# Patient Record
Sex: Female | Born: 1970 | Race: Black or African American | Hispanic: No | Smoking: Never smoker
Health system: Southern US, Community
[De-identification: ages and names within clinical notes are randomized; demographics above are authoritative.]

## PROBLEM LIST (undated history)

## (undated) ENCOUNTER — Ambulatory Visit (HOSPITAL_COMMUNITY): Admission: EM | Payer: 59

## (undated) DIAGNOSIS — I10 Essential (primary) hypertension: Secondary | ICD-10-CM

## (undated) DIAGNOSIS — R200 Anesthesia of skin: Secondary | ICD-10-CM

## (undated) DIAGNOSIS — Z780 Asymptomatic menopausal state: Secondary | ICD-10-CM

## (undated) DIAGNOSIS — G4730 Sleep apnea, unspecified: Secondary | ICD-10-CM

## (undated) DIAGNOSIS — H469 Unspecified optic neuritis: Secondary | ICD-10-CM

## (undated) DIAGNOSIS — E669 Obesity, unspecified: Secondary | ICD-10-CM

## (undated) DIAGNOSIS — J45909 Unspecified asthma, uncomplicated: Secondary | ICD-10-CM

## (undated) DIAGNOSIS — D649 Anemia, unspecified: Secondary | ICD-10-CM

## (undated) DIAGNOSIS — F32A Depression, unspecified: Secondary | ICD-10-CM

## (undated) HISTORY — DX: Anesthesia of skin: R20.0

## (undated) HISTORY — DX: Unspecified asthma, uncomplicated: J45.909

## (undated) HISTORY — DX: Obesity, unspecified: E66.9

## (undated) HISTORY — PX: TUBAL LIGATION: SHX77

## (undated) HISTORY — DX: Anemia, unspecified: D64.9

## (undated) HISTORY — DX: Sleep apnea, unspecified: G47.30

## (undated) HISTORY — DX: Depression, unspecified: F32.A

## (undated) HISTORY — PX: MOUTH SURGERY: SHX715

## (undated) HISTORY — DX: Essential (primary) hypertension: I10

## (undated) HISTORY — DX: Asymptomatic menopausal state: Z78.0

## (undated) HISTORY — DX: Unspecified optic neuritis: H46.9

---

## 2004-08-28 ENCOUNTER — Inpatient Hospital Stay (HOSPITAL_COMMUNITY): Admission: AD | Admit: 2004-08-28 | Discharge: 2004-08-30 | Payer: Self-pay | Admitting: Obstetrics and Gynecology

## 2004-09-10 ENCOUNTER — Ambulatory Visit: Admission: RE | Admit: 2004-09-10 | Discharge: 2004-09-10 | Payer: Self-pay | Admitting: Obstetrics and Gynecology

## 2005-09-08 ENCOUNTER — Other Ambulatory Visit: Admission: RE | Admit: 2005-09-08 | Discharge: 2005-09-08 | Payer: Self-pay | Admitting: Obstetrics and Gynecology

## 2005-09-30 ENCOUNTER — Ambulatory Visit (HOSPITAL_COMMUNITY): Admission: RE | Admit: 2005-09-30 | Discharge: 2005-09-30 | Payer: Self-pay | Admitting: Obstetrics and Gynecology

## 2006-10-18 ENCOUNTER — Ambulatory Visit (HOSPITAL_COMMUNITY): Admission: RE | Admit: 2006-10-18 | Discharge: 2006-10-18 | Payer: Self-pay | Admitting: Family Medicine

## 2007-01-20 ENCOUNTER — Emergency Department (HOSPITAL_COMMUNITY): Admission: EM | Admit: 2007-01-20 | Discharge: 2007-01-20 | Payer: Self-pay | Admitting: Emergency Medicine

## 2007-04-28 ENCOUNTER — Other Ambulatory Visit: Admission: RE | Admit: 2007-04-28 | Discharge: 2007-04-28 | Payer: Self-pay | Admitting: Obstetrics and Gynecology

## 2007-05-11 ENCOUNTER — Encounter: Admission: RE | Admit: 2007-05-11 | Discharge: 2007-05-11 | Payer: Self-pay | Admitting: Ophthalmology

## 2007-06-01 ENCOUNTER — Encounter (INDEPENDENT_AMBULATORY_CARE_PROVIDER_SITE_OTHER): Payer: Self-pay | Admitting: Ophthalmology

## 2007-06-01 ENCOUNTER — Ambulatory Visit (HOSPITAL_COMMUNITY): Admission: RE | Admit: 2007-06-01 | Discharge: 2007-06-01 | Payer: Self-pay | Admitting: Ophthalmology

## 2010-01-05 IMAGING — CR DG CHEST 1V
1 series · 1 of 1 positions shown · non-contrast
Comparison: NONE

CLINICAL DATA: Positive PPD. Chronic cough. 

CHEST - SINGLE VIEW (PA)

[view not recorded]
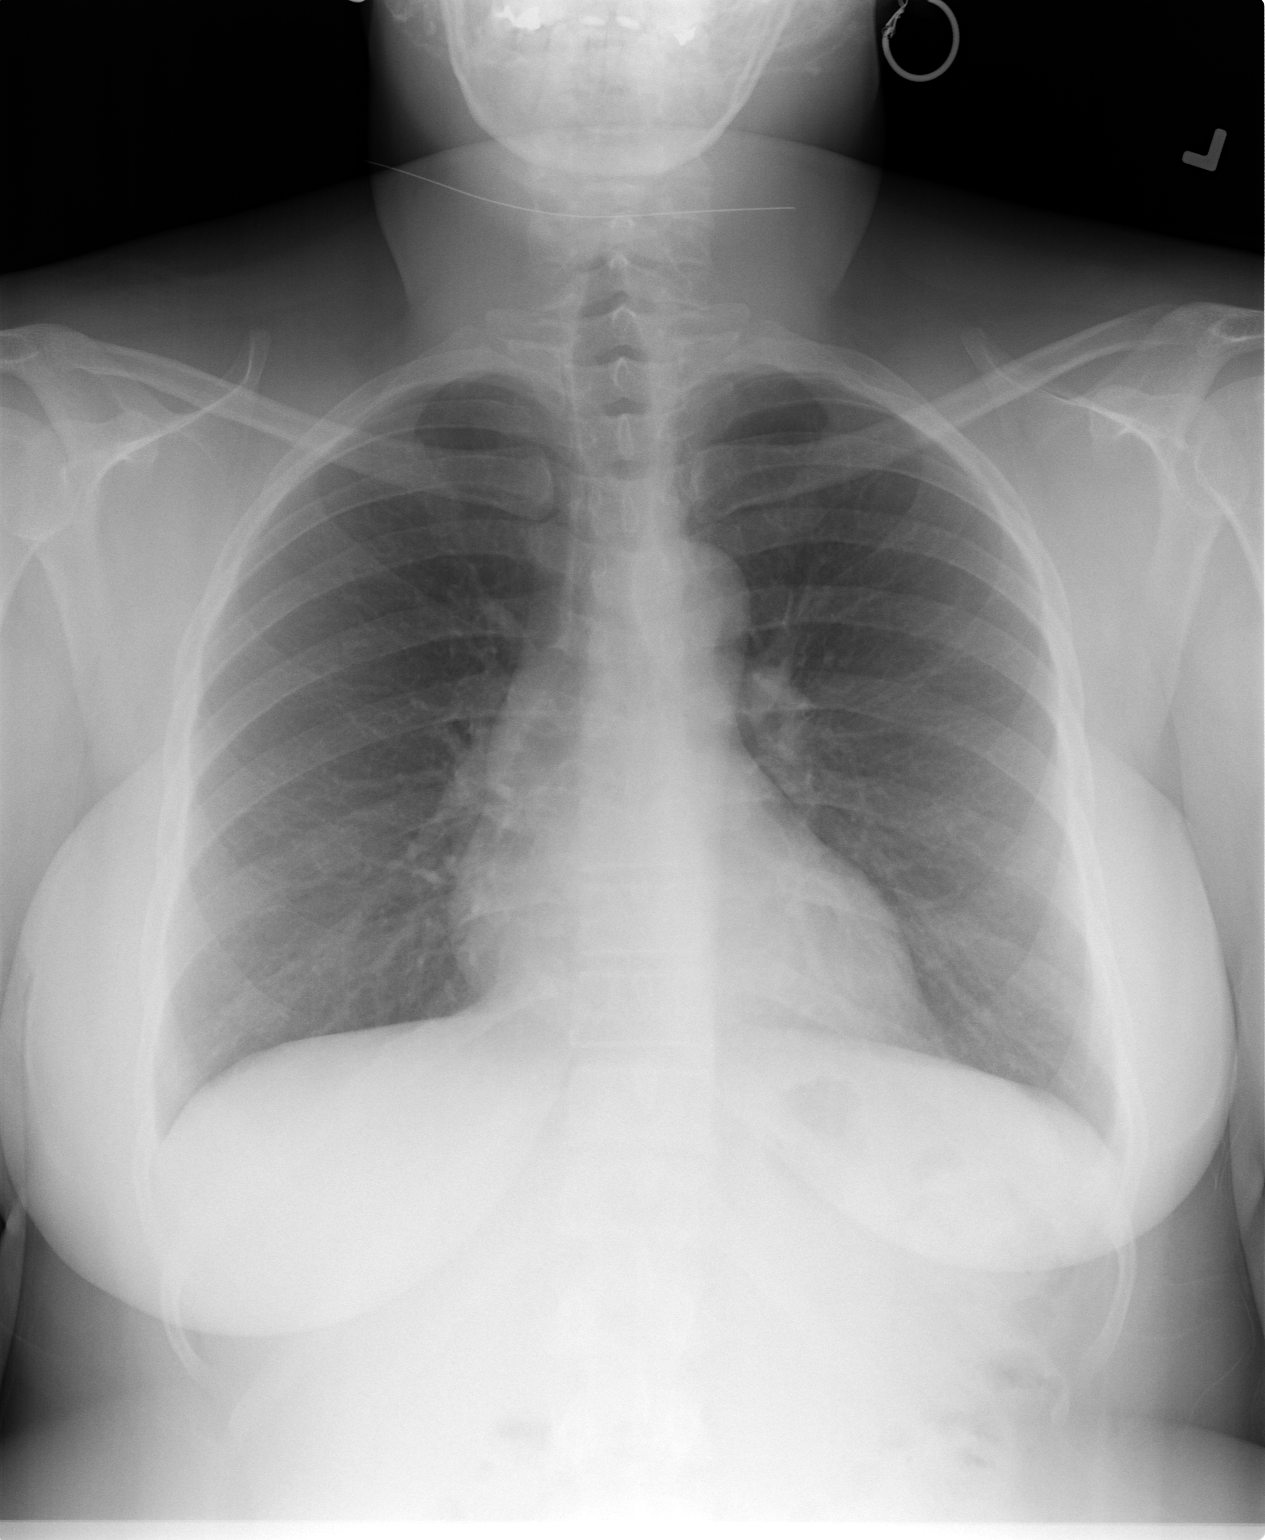

[1 of 1 positions shown; findings below may reference images not displayed]

FINDINGS: The heart size is upper normal. The left lung is clear. 
Located overlying the end of the anterior right fourth rib, there 
is nodular density versus overlapping shadows measuring 1.5 cm. 
Hilar and mediastinal structures are normal. Bones appear normal.
IMPRESSION: Nodular density versus overlapping densities at the 
end of the right fourth rib. I recommend further evaluation with 
repeat PA projection. Borderline cardiomegaly. Joshua Alexander Lagarda, 
M.D. electronically reviewed on 03/30/2007 Dict Date: 03/29/2007  
Tran Date: 03/30/2007 CAV  [REDACTED]

## 2010-01-13 ENCOUNTER — Encounter: Admission: RE | Admit: 2010-01-13 | Discharge: 2010-01-13 | Payer: Self-pay | Admitting: Internal Medicine

## 2010-06-27 NOTE — H&P (Signed)
Robin Fleming, Robin Fleming                ACCOUNT NO.:  [id number]   MEDICAL RECORD NO.:  [id number]          PATIENT TYPE:  MAT   LOCATION:  [facility]                          [facility]ILITY:  [facility]   PHYSICIAN:  Charles A. Delcambre, MDDATE OF BIRTH:  11/02/1970   DATE OF ADMISSION:  DATE OF DISCHARGE:                                HISTORY & PHYSICAL   CHIEF COMPLAINT:  History of delivery at term of 2600 g baby, now 40 weeks  and 5 days to be admitted for induction.   A 39 year old para 1-0-0-1, [facility] August 30, 2004, presents to be induced as  noted above.  Prenatal labs have been normal including quad screen normal.  Group B strep negative.  with noted fetal demise at 6 weeks 4 days, to be  admitted for D&E.  She is Rh negative, did receive RhoGAM on May 27, 2004.  She currently has some vaginal spotting, has passed no tissue.   PAST MEDICAL HISTORY:  None.   SURGICAL HISTORY:  None, SVD x 1.   MEDICATIONS:  Prenatal vitamins.   ALLERGIES:  No known drug allergies.   SOCIAL HISTORY:  No tobacco, ethanol, or drug use.  The patient is married,  in a monogamous relationship with her husband.   FAMILY HISTORY:  Father with kidney failure, deceased.  Otherwise, negative  for major illnesses.   REVIEW OF SYSTEMS:  No chest pain, shortness of breath, wheezing, diarrhea,  constipation.   PHYSICAL EXAMINATION:  GENERAL:  Alert and oriented x 3.  No distress.  VITAL SIGNS:  Blood pressure 100/72, weight 187 pounds.  HEENT:  Grossly within normal limits.  NECK:  Supple without thyromegaly.  LUNGS:  Clear bilaterally.  HEART:  Regular rate and rhythm with a 2/6 systolic murmur left sternal  border.  ABDOMEN:  Gravid.  Fundal height 39 cm the day of dictation.  CERVIX:  Pending examination at this time.  EXTREMITIES:  Mild edema to mid calf.   ASSESSMENT:  Intrauterine pregnancy to be 40 weeks and 5 days at time of  admission.   PLAN:  Artificial rupture of membranes.  High dose Pitocin.   Induction.  The  patient does give informed consent, and we will proceed as outlined.       CAD/MEDQ  D:  08/26/2004  T:  08/26/2004  Job:  [id number]

## 2010-06-27 NOTE — Op Note (Signed)
Robin Fleming, Robin Fleming                ACCOUNT NO.:  [id number]   MEDICAL RECORD NO.:  [id number]          PATIENT TYPE:  AMB   LOCATION:  SDC                           [facility]  [facility]   PHYSICIAN:  Charles A. Delcambre, MDDATE OF BIRTH:  1970-09-17   DATE OF PROCEDURE:  09/30/2005  DATE OF DISCHARGE:                                 OPERATIVE REPORT   PREOPERATIVE DIAGNOSIS:  Undesired fertility.   POSTOPERATIVE DIAGNOSIS:  Undesired fertility.   PROCEDURE:  Laparoscopic bilateral tubal ligation with Falope rings.   SURGEON:  Charles A. Delcambre, MD   ASSISTANT:  None.   COMPLICATIONS:  None.   Instrument, sponge and needle count correct x2.   ESTIMATED BLOOD LOSS:  Less than 5 mL.   ANESTHESIA:  General via the endotracheal route.   SPECIMENS:  None.   OPERATIVE FINDINGS:  Normal pelvis.   DESCRIPTION OF PROCEDURE:  The patient was taken to the operating room and  placed in the supine position and a general anesthetic was induced without  difficulty.  She was then placed in the dorsal lithotomy position and a  sterile prep and drape was undertaken.  A Cohen cannula was placed on the  cervix with a single-tooth tenaculum.  Attention was turned to the abdomen.  Marcaine 0.25% plain was injected at the umbilicus and suprapubic trocar  sites, a total of 7 mL divided between the two sites.  An approximately 1 cm  incision was made at the umbilicus with a knife.  Anterior traction was  placed on the abdominal wall and the Veress needle was placed with  aspiration, injection, reaspiration, hanging drop test, and insufflation  pressure was less than 8 mmHg with CO2 at 2 L/min., all indicating  intraperitoneal location.  Adequate pneumoperitoneum was achieved at 2.5 L  insufflation total.  The Veress needle was removed and anterior traction was  placed again on the abdominal wall and an 11 mm port was placed without  difficulty.  There was no damage to bowel, bladder or vascular  structures  upon visualization directly with the scope placed through this port site.  Under direct visualization the Falope ring applicator port was placed  through a stab incision two fingerbreadths above the symphysis pubis in the  midline.  There was no damage to bowel, bladder or vascular structures.  Pelviscopy was undertaken and findings are noted above.  Falope rings were  applied to the tubes approximately 2.5 cm from the uterine cornual regions  bilaterally.  Adequate knuckle of tube was pulled up through the ring and  blanched on either side.  Hemostasis of the trocar sites was excellent upon  visualization with low pressure.  All instruments were removed.  A 0 Vicryl  was used to close the fascia at the umbilical port with a single stitch.  Subcuticular 3-0 Monocryl was used to close the skin.  A sterile dressing  was  applied.  Dermabond was used to close the suprapubic incision site.  Vaginal  instruments were removed.  Hemostasis was adequate.  The patient awakened  and taken to recovery with physician  in attendance having tolerated the  procedure well.      Charles A. Sydnee Cabal, MD  Electronically Signed     CAD/MEDQ  D:  09/30/2005  T:  10/01/2005  Job:  [id number]

## 2010-11-04 LAB — CSF CELL COUNT WITH DIFFERENTIAL
RBC Count, CSF: 1015 — ABNORMAL HIGH
Tube #: 3
WBC, CSF: 3

## 2010-11-04 LAB — CSF CULTURE W GRAM STAIN: Culture: NO GROWTH

## 2010-11-04 LAB — PROTEIN AND GLUCOSE, CSF
Glucose, CSF: 66
Total  Protein, CSF: 19

## 2012-01-04 ENCOUNTER — Other Ambulatory Visit: Payer: Self-pay | Admitting: Internal Medicine

## 2012-01-04 DIAGNOSIS — Z1231 Encounter for screening mammogram for malignant neoplasm of breast: Secondary | ICD-10-CM

## 2012-02-19 ENCOUNTER — Ambulatory Visit
Admission: RE | Admit: 2012-02-19 | Discharge: 2012-02-19 | Disposition: A | Discharge status: Home / Self Care | Payer: 59 | Source: Ambulatory Visit | Attending: Internal Medicine | Admitting: Internal Medicine

## 2012-02-19 DIAGNOSIS — Z1231 Encounter for screening mammogram for malignant neoplasm of breast: Secondary | ICD-10-CM

## 2012-04-07 ENCOUNTER — Encounter: Payer: Self-pay | Admitting: *Deleted

## 2012-04-07 ENCOUNTER — Encounter: Payer: 59 | Attending: Obstetrics & Gynecology | Admitting: *Deleted

## 2012-04-07 DIAGNOSIS — Z713 Dietary counseling and surveillance: Secondary | ICD-10-CM | POA: Insufficient documentation

## 2012-04-07 DIAGNOSIS — E669 Obesity, unspecified: Secondary | ICD-10-CM | POA: Insufficient documentation

## 2012-04-07 NOTE — Patient Instructions (Signed)
Plan:  Aim for 3 Carb Choices per meal (45 grams) +/- 1 either way  Aim for 0-1 Carbs per snack if hungry  Consider reading food labels for Total Carbohydrate of foods Consider  increasing your activity level by Hip Hop on DVD for 15-30 minutes daily as tolerated

## 2012-04-07 NOTE — Progress Notes (Signed)
  Medical Nutrition Therapy:  Appt start time: 1630 end time:  1730.  Assessment:  Primary concerns today: patient here for obesity. Lives with children, 2 boys ages 25 and 43 years old. 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 Taking on-line classes for Information Systems. Church on Sunday mornings. Current activity is Hip Hop Abs DVD at home, every day in evenings after work. Also enjoys walking in good weather. Some limitation due to asthma  MEDICATIONS: see list   DIETARY INTAKE:  Usual eating pattern includes 3 meals and 1 snacks per day.  Everyday foods include good variety of all food groups.  Avoided foods include pork, .    24-hr recall:  B ( AM): unsweetened cereal with 2% milk, OR eggs with sausage and tomatoes, occasionally pancakes or 2 toast.   Snk ( AM): grapes or small bag of chips OR peanuts  L ( PM): brings from home: Lean Cuisine dinner and fresh fruit, regular soda 12 oz Snk ( PM): not usually D ( PM): she prepares usually: meat, starch and vegetable type meal, occasionally a bread, fruit juice or Koolaid or Lemonade Snk ( PM): none Beverages:  fruit juice or Koolaid or Lemonade or regular soda 12 oz   Usual physical activity: Current activity is Hip Hop Abs DVD at home, every day in evenings after work. Also enjoys walking in good weather.   Estimated energy needs: 1400 calories 158 g carbohydrates 105 g protein 39 g fat  Progress Towards Goal(s):  In progress.   Nutritional Diagnosis:  NI-1.5 Excessive energy intake As related to activity level.  As evidenced by BMI of 37.3.    Intervention:  Nutrition counseling for weight loss initiated. Discussed Carb Counting as method of portion control. Taught reading of food labels and discussed benefits of increased activity. Also discussed ways to eat healthy with a busy schedule.  Plan:  Aim for 3 Carb Choices per meal (45 grams) +/- 1 either way  Aim for 0-1 Carbs per snack if hungry   Consider reading food labels for Total Carbohydrate of foods Consider  increasing your activity level by Hip Hop on DVD for 15-30 minutes daily as tolerated  Handouts given during visit include: Carb Counting and Food Label handouts Meal Plan Card  Monitoring/Evaluation:  Dietary intake, exercise, reading food labels, and body weight in 4 week(s).

## 2012-05-05 ENCOUNTER — Encounter: Payer: Self-pay | Admitting: *Deleted

## 2012-05-05 ENCOUNTER — Encounter: Payer: 59 | Attending: Obstetrics & Gynecology | Admitting: *Deleted

## 2012-05-05 DIAGNOSIS — E669 Obesity, unspecified: Secondary | ICD-10-CM | POA: Insufficient documentation

## 2012-05-05 DIAGNOSIS — Z713 Dietary counseling and surveillance: Secondary | ICD-10-CM | POA: Insufficient documentation

## 2012-05-05 NOTE — Patient Instructions (Addendum)
Plan: 15 minutes activity per day, either DVD or The 7 Minute Workout, your choice Continue with healthier food choices and reading food labels

## 2012-05-05 NOTE — Progress Notes (Signed)
  Medical Nutrition Therapy:  Appt start time: 1630 end time:  1700.  Assessment:  Primary concerns today: patient here for obesity follow up visit. Weight is stable from last visit. Reading food labels, portions are smaller, she makes adjustments based on carb content of foods and is eating more free foods. Exercise time is limited due to school and work as well as children. School classes are on-line and very time consuming. She is noticing changes in how her clothes fit, waist is smaller.  MEDICATIONS: see list  TANITA  BODY COMP RESULTS 05/05/2012  Weight 193.5 lb   BMI (kg/m^2) 37.8   Fat Mass (lbs) 88.0 lb   Fat Free Mass (lbs) 105.5 lb   Total Body Water (lbs) 77.0 lb   DIETARY INTAKE:  Usual eating pattern includes 3 meals and 1 snacks per day.  Everyday foods include good variety of all food groups.  Avoided foods include pork, .    24-hr recall:  B ( AM): unsweetened cereal with 2% milk, OR eggs with sausage and tomatoes, occasionally pancakes or 2 toast.   Snk ( AM): grapes or small bag of chips OR peanuts  L ( PM): brings from home: Lean Cuisine dinner and fresh fruit, no more regular soda  Snk ( PM): not usually D ( PM): she prepares usually: meat, starch and vegetable type meal, occasionally a bread, fruit juice or Koolaid or Lemonade Snk ( PM): none Beverages:  fruit juice or Koolaid or Lemonade   Usual physical activity: has been limited due to heavy school schedule in addition to work and children.   Estimated energy needs: 1400 calories 158 g carbohydrates 105 g protein 39 g fat  Progress Towards Goal(s):  In progress.   Nutritional Diagnosis:  NI-1.5 Excessive energy intake As related to activity level.  As evidenced by BMI of 37.3.    Intervention:  Acknowledged all the strains she has on her time and suggested she aim for a smaller increment of time towards exercise rather than an expectation of 60 minutes or more. She agrees to aim for 7-15 minutes per  day and we discussed ways to implement this.  Plan: 15 minutes activity per day, either DVD or The 7 Minute Workout, your choice Continue with healthier food choices and reading food labels   Handouts given during visit include:   APP for The 7 Minute Workout   Monitoring/Evaluation:  Dietary intake, exercise, reading food labels, and body weight in 6 week(s).

## 2012-05-18 ENCOUNTER — Ambulatory Visit (INDEPENDENT_AMBULATORY_CARE_PROVIDER_SITE_OTHER): Payer: 59 | Admitting: Obstetrics & Gynecology

## 2012-05-18 ENCOUNTER — Encounter: Payer: Self-pay | Admitting: Obstetrics & Gynecology

## 2012-05-18 VITALS — BP 124/87 | HR 73 | Temp 98.2°F | Ht 60.0 in | Wt 190.0 lb

## 2012-05-18 DIAGNOSIS — N926 Irregular menstruation, unspecified: Secondary | ICD-10-CM

## 2012-05-18 DIAGNOSIS — N393 Stress incontinence (female) (male): Secondary | ICD-10-CM

## 2012-05-18 NOTE — Patient Instructions (Signed)
Transobturator Tape Sling Laughing, lifting objects, coughing, and sneezing all put pressure on your bladder. When this happens, urine sometimes leaks. This problem is called stress urinary incontinence. If this is caused by weak pelvic muscles and ligaments, one way to keep urine from leaking is with a transobturator tape (TOT) sling. In this procedure, a surgeon uses man-made material (the "tape") to create a sling to support the urethra. The sling does the same job that the muscles and ligaments would do if they were not damaged. The name of the procedure comes from the space in your hip bones (the obturator foramen) where the tape is threaded into the body. LET YOUR CAREGIVER KNOW ABOUT:  Allergies.  Medicines you take, including herbs, eyedrops, over-the-counter medicines, and creams.  Blood thinners (anticoagulants), aspirin or other drugs that could affect blood clotting.  Use of steroids (by mouth or creams).  Possibility of pregnancy, if this applies.  History of blood clots or bleeding problems.  Smoking history.  Previous surgery.  Previous problems with anesthetics, including local anesthetics.  Any other health problems. RISKS AND COMPLICATIONS Most women recover without problems. Sometimes, though, women experience:  Difficulty urinating. This may happen temporarily with normal tissue swelling from the surgery or more long term if the sling was pulled up a little too tightly.  Some continued leakage. This can happen if parts of the tape wear away because of rubbing against the body or if the tape was put in with too little tension.  Other complications. These are rare, but possible:  Pain in the groin or vagina.  Infection.  Excessive bleeding.  Damage to the bladder or urethra.  Nerve damage. This can cause a loss of sensation in the area.  A hole in the urethra from erosion caused by the tape. The tape must then be removed.  You may need an additional  surgery. This is rare. BEFORE THE PROCEDURE  Two weeks before your surgery, stop using aspirin and non-steroidal anti-inflammatory drugs (NSAIDs) for pain relief. This includes prescription drugs and over-the-counter drugs, such as ibuprofen and naproxen. Also stop taking vitamin E.  If you take blood-thinners, ask your caregiver when you should stop taking them.  Do not eat or drink for about 8 hours before your surgery.  You might be given a cream to place in your vagina before the procedure.  If your surgery is an outpatient procedure, you will be able to go home the same day. Make arrangements in advance for someone to drive you home. THE PROCEDURE The procedure usually takes 20 to 30 minutes.  Preparation:  The surgery can be done while you are asleep (general anesthesia) or while an area of your body is numb, but you are awake (regional anesthesia).  You will wear a surgical gown. Your groin and vaginal area will be draped with sheets.  Procedure:  The surgeon will make two small cuts (incisions) in your groin, just inside your thighs on either side of the vagina, and one in your vagina under the urethra.  The sling will then be threaded from one incision to the other, under the urethra.  A tube (catheter) will be inserted into your urethra to collect and drain urine during and after the procedure. AFTER THE PROCEDURE  The catheter will be left in place for a 2 to 3 hours. Once it is removed, you will have to urinate on your own before you can go home. An overnight stay is sometimes needed.  The surgeon [phone]  prescribe antibiotic medicines to prevent infection.  You might be given a cream to apply to the vagina to help the area heal.  It could take 2 weeks for urination to feel normal. HOME CARE INSTRUCTIONS   Take any medicine and use any creams that your caregiver prescribed. Follow the directions carefully. Take all of the medicine and use all of the cream.  Ask your  surgeon whether you can take over-the-counter medicines for pain, discomfort, or fever. Do not take aspirin unless your caregiver says that you should. Aspirin increases the chances of bleeding.  Avoid sexual intercourse, douching, using tampons, or placing anything inside your vagina for about 4 weeks. Your caregiver will let you know specifically when it is okay to do these things again.  Limit activities, such as exercising and dancing, for 4 to 6 weeks.  Keep all follow-up tests and appointments with your caregiver. Tests can show whether your urination is back to normal. SEEK MEDICAL CARE IF:   You have pain or frequent urges to urinate.  You have difficulty passing urine.  Your wounds become red or swollen, or they leak fluid or blood.  Your pain increases.  You notice unusual or bad-smelling discharge from your vagina.  You become nauseous or vomit for more than 2 days after the surgery. SEEK IMMEDIATE MEDICAL CARE IF:   You have a fever. Document Released: 04/24/2008 Document Revised: 04/20/2011 Document Reviewed: 04/24/2008 [facility] Patient Information 2013 [place], [place].

## 2012-05-18 NOTE — Progress Notes (Signed)
..   Subjective:     Robin Fleming is a 42 y.o. female here for her ultrasound results and bloodwork. No current complaints.  Personal health questionnaire reviewed: no.   Gynecologic History Patient's last menstrual period was 03/19/2012. Contraception: tubal ligation Last Pap: 2014. Results were: normal Last mammogram: 02/2012. Results were: normal   The following portions of the patient's history were reviewed and updated as appropriate: allergies, current medications, past family history, past medical history, past social history, past surgical history and problem list.  Review of Systems Pertinent items are noted in HPI.    Voiding diary reviewed Objective:    No exam   PVR 10 ml Assessment:   AUB--O  GSUI   Plan:    Referral for PT for urinary incontinence Return prn

## 2012-05-19 ENCOUNTER — Encounter: Payer: Self-pay | Admitting: Obstetrics & Gynecology

## 2012-06-16 ENCOUNTER — Encounter: Payer: 59 | Attending: Obstetrics & Gynecology | Admitting: *Deleted

## 2012-06-16 DIAGNOSIS — Z713 Dietary counseling and surveillance: Secondary | ICD-10-CM | POA: Insufficient documentation

## 2012-06-16 DIAGNOSIS — E669 Obesity, unspecified: Secondary | ICD-10-CM | POA: Insufficient documentation

## 2012-06-16 NOTE — Progress Notes (Signed)
  Medical Nutrition Therapy:  Appt start time: 1730 end time:  1800.  Assessment:  Primary concerns today: patient here for obesity follow up visit. She states she has eliminated bread and pasta in past month. Snacking on apples and carrots. Taking Muscle Milk for breakfast and at night before bed. Exercising with a DVD  for an hour and The 7 Minute Workout APP when she can because school is finished for now. She states she is drinking at least 32 oz water per day and then a little more.  She is noticing changes in how her clothes fit, waist is smaller.  MEDICATIONS: see list  TANITA  BODY COMP RESULTS 05/05/2012  Weight 193.5 lb   BMI (kg/m^2) 37.8   Fat Mass (lbs) 88.0 lb   Fat Free Mass (lbs) 105.5 lb   Total Body Water (lbs) 77.0 lb   TANITA  BODY COMP RESULTS 06/16/2012  Weight 193.5 lb   BMI (kg/m^2) 37.8   Fat Mass (lbs) 89.5 lb   Fat Free Mass (lbs) 104.0 lb   Total Body Water (lbs) 76.0 lb   Basically no change, appears reduction in fat free mass even with increased activity level and improved eating habits. Frustrating for patient today.  DIETARY INTAKE:  Usual eating pattern includes 3 meals and 1 snacks per day.  Everyday foods include good variety of all food groups.  Avoided foods include pork, .    24-hr recall:  B ( AM): unsweetened cereal with 2% milk, OR eggs with sausage and tomatoes, occasionally pancakes or 1 toast. Water Snk ( AM): grapes OR small bag of chips OR peanuts  L ( PM): brings from home: Lean Cuisine dinner and fresh fruit, OR pkg tuna with Activia yogurt, no more regular soda  Snk ( PM): not usually D ( PM): she prepares usually: meat, starch and vegetable type meal, occasionally a bread, fruit juice or water Snk ( PM): none Beverages:  fruit juice or Koolaid or Lemonade   Usual physical activity: has increased to 1 hour a day with her DVD now that school is finished.   Estimated energy needs: 1400 calories 158 g carbohydrates 105 g  protein 39 g fat  Progress Towards Goal(s):  In progress.   Nutritional Diagnosis:  NI-1.5 Excessive energy intake As related to activity level.  As evidenced by BMI of 37.3.    Intervention:  Commended her on her improved eating and activity habits. Encouraged her to consider these behavior changes as part of her success rather that numbers on the scale. She states she is willing to continue with current plan acknowledging that it will take some time.  Plan: Continue with 15  - 30  Minutes activity per day, either DVD or The 7 Minute Workout, your choice Continue with healthier food choices and reading food labels   Handouts given during visit include:   None at this visit  Monitoring/Evaluation:  Dietary intake, exercise, reading food labels, and body weight in 5 month(s).

## 2012-11-16 ENCOUNTER — Encounter: Payer: Self-pay | Admitting: Obstetrics & Gynecology

## 2012-11-16 ENCOUNTER — Ambulatory Visit (INDEPENDENT_AMBULATORY_CARE_PROVIDER_SITE_OTHER): Payer: 59 | Admitting: Obstetrics & Gynecology

## 2012-11-16 VITALS — BP 177/101 | HR 86 | Temp 98.0°F | Ht 64.0 in | Wt 192.0 lb

## 2012-11-16 DIAGNOSIS — N951 Menopausal and female climacteric states: Secondary | ICD-10-CM

## 2012-11-16 NOTE — Progress Notes (Signed)
Subjective:     Robin Fleming is a 42 y.o. female here for a routine exam.  Current complaints: follow up. Pt states she is having night sweats. Pt states she is having some stress incontinence. Pt states her coughing has calmed down so the episodes are less frequent.  Personal health questionnaire reviewed: yes.   Gynecologic History No LMP recorded. Contraception: abstinence Last Pap: 02/2012. Results were: normal Last mammogram: 02/2012. Results were: normal  Obstetric History OB History  No data available     The following portions of the patient's history were reviewed and updated as appropriate: allergies, current medications, past family history, past medical history, past social history, past surgical history and problem list.  Review of Systems Pertinent items are noted in HPI.    Objective:   No exam today  Assessment:    Stress urinary incontinence--less symptomatic  Plan:    Continue to monitor symptoms; return prn

## 2012-11-17 LAB — ESTRADIOL: Estradiol: 19.8 pg/mL

## 2012-11-17 LAB — PROLACTIN: Prolactin: 7.4 ng/mL

## 2012-11-17 LAB — TSH: TSH: 1.12 u[IU]/mL (ref 0.350–4.500)

## 2012-11-17 LAB — FOLLICLE STIMULATING HORMONE: FSH: 86.3 m[IU]/mL

## 2012-11-17 LAB — HCG, SERUM, QUALITATIVE: Preg, Serum: NEGATIVE

## 2012-11-22 ENCOUNTER — Encounter: Payer: Self-pay | Admitting: Obstetrics & Gynecology

## 2012-11-22 DIAGNOSIS — E28319 Asymptomatic premature menopause: Secondary | ICD-10-CM | POA: Insufficient documentation

## 2012-11-24 ENCOUNTER — Telehealth: Payer: Self-pay | Admitting: *Deleted

## 2012-11-24 ENCOUNTER — Encounter: Payer: Self-pay | Admitting: Obstetrics

## 2012-11-24 MED ORDER — ESTRADIOL-LEVONORGESTREL 0.045-0.015 MG/DAY TD PTWK: 1.0000 | MEDICATED_PATCH | TRANSDERMAL | Status: DC

## 2012-11-24 MED ORDER — NORETHIN ACE-ETH ESTRAD-FE 1-20 MG-MCG(24) PO TABS: 1.0000 | ORAL_TABLET | Freq: Every day | ORAL | Status: DC

## 2012-11-24 NOTE — Telephone Encounter (Signed)
Patient presented to office today stating that she has been back and forth with phone calls and was to have a rx sent to the pharmacy. She states she has checked with the pharmacy, and they did not have the medication that was prescribed.  Patient was informed per Dr Tamela Oddi that she is to start loestrin once a day. Patient stated she preferred a patch. She was advised per Dr Tamela Oddi because of age the pill would be better. Patient stated that she is taking other medication and did not want another pill to interfere with her current medications. Patient was informed that Climara Pro would be prescribed for he in place of the pill per Dr Jean RosenthalChristell Constant. Patient was informed rx was sent to pharmacy. She was advised to watch for any changes and report if the medication is not working for her. Patient provided copy of her blood work per her request. She stated that she will be following up with her primary care provider on 11/25/2012 for a check and would have her cholesterol checked at that time. She was informed that [facility] would like a copy of the test results. Patient has verbalized understanding and stated that she  would have a copy of the labs forwarded to office.

## 2013-01-17 ENCOUNTER — Encounter: Payer: Self-pay | Admitting: Obstetrics & Gynecology

## 2013-01-18 ENCOUNTER — Other Ambulatory Visit: Payer: Self-pay | Admitting: Obstetrics & Gynecology

## 2013-01-18 DIAGNOSIS — N926 Irregular menstruation, unspecified: Secondary | ICD-10-CM

## 2013-01-25 ENCOUNTER — Ambulatory Visit: Payer: 59

## 2013-01-25 ENCOUNTER — Ambulatory Visit (INDEPENDENT_AMBULATORY_CARE_PROVIDER_SITE_OTHER): Payer: 59 | Admitting: Obstetrics & Gynecology

## 2013-01-25 ENCOUNTER — Encounter: Payer: Self-pay | Admitting: Obstetrics & Gynecology

## 2013-01-25 VITALS — BP 147/87 | HR 77 | Temp 98.6°F | Wt 190.0 lb

## 2013-01-25 DIAGNOSIS — E28319 Asymptomatic premature menopause: Secondary | ICD-10-CM

## 2013-01-25 MED ORDER — NORETHIN-ETH ESTRAD-FE BIPHAS 1 MG-10 MCG / 10 MCG PO TABS: 1.0000 | ORAL_TABLET | Freq: Every day | ORAL | Status: DC

## 2013-01-25 NOTE — Progress Notes (Signed)
Subjective:     Robin Fleming is a 42 y.o. female here for a follow up exam.  Current complaints: Pt is in office today to f/u on medication.  Pt states that she is having more anger and rage since starting on the Climara that was prescribed on 12/04/12.  Pt states she used her last patch on Saturday.  Pt has since taken her loestrin pill since then.  Pt states that she has last anger since taken pills..  Personal health questionnaire reviewed: yes.   Gynecologic History Patient's last menstrual period was 12/25/2012. Contraception: abstinence   Obstetric History OB History  No data available     The following portions of the patient's history were reviewed and updated as appropriate: allergies, current medications, past family history, past medical history, past social history, past surgical history and problem list.  Review of Systems Pertinent items are noted in HPI.    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

## 2013-01-25 NOTE — Patient Instructions (Signed)
Premenstrual Syndrome Premenstrual syndrome (PMS) is a condition that consists of physical, emotional, and behavioral symptoms that affect women of childbearing age. PMS occurs 5 14 days before the start of a menstrual period and often recurs in a predictable pattern. The symptoms go away a few days after the menstrual period starts. PMS can interfere in many ways with normal daily activities and can range from mild to severe. When PMS is considered severe, it may be diagnosed as premenstrual dysphoric disorder (PMDD). A small percentage of women are affected by PMS symptoms and an even smaller percentage of those women are affected by PMDD.  CAUSES  The exact cause of PMS is unknown, but it seems to be related to cyclic hormone changes that happen before menstruation. These hormones are thought to affect chemicals in the brain (serotonin) that can influence a person's mood.  SYMPTOMS  Symptoms of PMS recur consistently from month to month and go away completely after the menstrual period starts. The most common emotional or behavioral symptom is mood swings. These mood swings can be disabling and interfere with normal activities of daily living. Other common symptoms include depression and angry outbursts. Other symptoms may include:   Irritability.  Anxiety.  Crying spells.   Food cravings or appetite changes.   Changes in sexual desire.   Confusion.   Aggression.   Social withdrawal.   Poor concentration. The most common physical symptoms include a sense of bloating, breast pain, headaches, and extreme fatigue. Other physical symptoms include:   Backaches.   Swelling of the hands and feet.   Weight gain.   Hot flashes.  DIAGNOSIS  To make a diagnosis, your caregiver will ask questions to confirm that you are having a pattern of symptoms. Symptoms must:   Be present 5 days before the start of your period and be present at least 3 months in a row.   End within 4 days  after your period starts.   Interfere with some of your normal activities.  Other conditions, such as thyroid disease, depression, and migraine headaches must be ruled out before a diagnosis of PMS is confirmed.  TREATMENT  Your caregiver may suggest ways to maintain a healthy lifestyle, such as exercise. Over-the-counter pain relievers may ease cramps, aches, pains, headaches, and breast tenderness. However, selective serotonin reuptake inhibitors (SSRIs) are medicines that are most beneficial in improving PMS if taken in the second half of the monthly cycle. They may be taken on a daily basis. The most effective oral contraceptive pill used for symptoms of PMS is one that contains the ingredient drospirenone. Taking 4 days off of the pill instead of the usual 7 days also has shown to increase effectiveness.  There are a number of drugs, dietary supplements, vitamins, and water pills (diuretics) which have been suggested to be helpful but have not shown to be of any benefit to improving PMS symptoms.  HOME CARE INSTRUCTIONS   For 2 3 months, write down your symptoms, their severity, and how long they last. This may help your caregiver prescribe the best treatment for your symptoms.  Exercise regularly as suggested by your caregiver.  Eat a regular, well-balanced diet.  Avoid caffeine, alcohol, and tobacco consumption.  Limit salt and salty foods to lessen bloating and fluid retention.  Get enough sleep. Practice relaxation techniques.  Drink enough fluids to keep your urine clear or pale yellow.  Take medicines as directed by your caregiver.  Limit stress.  Take a multivitamin as directed   by your caregiver. Document Released: 01/24/2000 Document Revised: 10/21/2011 Document Reviewed: 06/15/2011 [employer] Patient Information 2014 [employer].  

## 2013-01-31 ENCOUNTER — Other Ambulatory Visit: Payer: Self-pay | Admitting: Obstetrics & Gynecology

## 2013-01-31 ENCOUNTER — Ambulatory Visit (INDEPENDENT_AMBULATORY_CARE_PROVIDER_SITE_OTHER): Payer: 59

## 2013-01-31 DIAGNOSIS — N926 Irregular menstruation, unspecified: Secondary | ICD-10-CM

## 2013-01-31 DIAGNOSIS — N946 Dysmenorrhea, unspecified: Secondary | ICD-10-CM

## 2013-01-31 DIAGNOSIS — D649 Anemia, unspecified: Secondary | ICD-10-CM

## 2013-03-01 ENCOUNTER — Encounter: Payer: Self-pay | Admitting: Obstetrics & Gynecology

## 2013-04-04 ENCOUNTER — Other Ambulatory Visit: Payer: Self-pay

## 2013-04-04 DIAGNOSIS — Z1231 Encounter for screening mammogram for malignant neoplasm of breast: Secondary | ICD-10-CM

## 2013-04-18 ENCOUNTER — Ambulatory Visit
Admission: RE | Admit: 2013-04-18 | Discharge: 2013-04-18 | Disposition: A | Discharge status: Home / Self Care | Payer: 59 | Source: Ambulatory Visit

## 2013-04-18 DIAGNOSIS — Z1231 Encounter for screening mammogram for malignant neoplasm of breast: Secondary | ICD-10-CM

## 2013-04-27 ENCOUNTER — Encounter: Payer: Self-pay | Admitting: Obstetrics

## 2013-07-26 ENCOUNTER — Ambulatory Visit: Payer: 59 | Admitting: Obstetrics & Gynecology

## 2013-08-03 ENCOUNTER — Ambulatory Visit (INDEPENDENT_AMBULATORY_CARE_PROVIDER_SITE_OTHER): Payer: 59 | Admitting: Obstetrics & Gynecology

## 2013-08-03 ENCOUNTER — Encounter: Payer: Self-pay | Admitting: Obstetrics & Gynecology

## 2013-08-03 VITALS — BP 126/89 | HR 89 | Temp 99.2°F | Ht 61.0 in | Wt 193.0 lb

## 2013-08-03 DIAGNOSIS — N393 Stress incontinence (female) (male): Secondary | ICD-10-CM

## 2013-08-04 NOTE — Progress Notes (Signed)
Patient ID: Robin Fleming, female   DOB: 1970/09/09, 43 y.o.   MRN: [id number]  Chief Complaint  Patient presents with  . Follow-up    HPI Robin Fleming is a 43 y.o. female.  C/O stress urinary incontinence symptoms  HPI  Past Medical History  Diagnosis Date  . Obesity   . Asthma     Past Surgical History  Procedure Laterality Date  . Tubal ligation    . Mouth surgery      Family History  Problem Relation Age of Onset  . Hypertension Mother   . Kidney disease Father     Social History History  Substance Use Topics  . Smoking status: Never Smoker   . Smokeless tobacco: Never Used  . Alcohol Use: No    Allergies  Allergen Reactions  . Gadolinium      Desc: Pt developed nausea and vomiting directly after hand injecting 17cc Multihance for MRI Brain.     Current Outpatient Prescriptions  Medication Sig Dispense Refill  . albuterol (PROVENTIL) (2.5 MG/3ML) 0.083% nebulizer solution Take 2.5 mg by nebulization every 6 (six) hours as needed for wheezing.      . montelukast (SINGULAIR) 10 MG tablet       . Norethindrone-Ethinyl Estradiol-Fe Biphas (LO LOESTRIN FE) 1 MG-10 MCG / 10 MCG tablet Take 1 tablet by mouth daily.  1 Package  11   No current facility-administered medications for this visit.    Review of Systems Review of Systems Constitutional: negative for fatigue and weight loss Respiratory: negative for cough and wheezing Cardiovascular: negative for chest pain, fatigue and palpitations Gastrointestinal: negative for abdominal pain and change in bowel habits Genitourinary: positive for loss of urine with coughing/sneezing, nocturia Integument/breast: negative for nipple discharge Musculoskeletal:negative for myalgias Neurological: negative for gait problems and tremors Behavioral/Psych: negative for abusive relationship, depression Endocrine: negative for temperature intolerance     Blood pressure 126/89, pulse 89, temperature 99.2 F (37.3 C), height 5'  1" (1.549 m), weight 87.544 kg (193 lb).  Physical Exam Physical Exam   50% of 15 min visit spent on counseling and coordination of care.   Data Reviewed None  Assessment    ?Mixed incontinence, predominant symptoms c/w GSUI    Plan    Possible management options include: TOT, PT or both Follow up as needed or in 2 weeks for possible pre-op visit         JACKSON-MOORE,LISA A 08/04/2013, 12:19 PM

## 2013-08-04 NOTE — Patient Instructions (Signed)
Transobturator Tape Sling Laughing, lifting objects, coughing, and sneezing all put pressure on your bladder. When this happens, urine sometimes leaks. This problem is called stress urinary incontinence. If this is caused by weak pelvic muscles and ligaments, one way to keep urine from leaking is with a transobturator tape (TOT) sling. In this procedure, a surgeon uses man-made material (the "tape") to create a sling to support the urethra. The sling does the same job that the muscles and ligaments would do if they were not damaged. The name of the procedure comes from the space in your hip bones (the obturator foramen) where the tape is threaded into the body. LET YOUR CAREGIVER KNOW ABOUT:  Allergies.  Medicines you take, including herbs, eyedrops, over-the-counter medicines, and creams.  Blood thinners (anticoagulants), aspirin or other drugs that could affect blood clotting.  Use of steroids (by mouth or creams).  Possibility of pregnancy, if this applies.  History of blood clots or bleeding problems.  Smoking history.  Previous surgery.  Previous problems with anesthetics, including local anesthetics.  Any other health problems. RISKS AND COMPLICATIONS Most women recover without problems. Sometimes, though, women experience:  Difficulty urinating. This may happen temporarily with normal tissue swelling from the surgery or more long term if the sling was pulled up a little too tightly.  Some continued leakage. This can happen if parts of the tape wear away because of rubbing against the body or if the tape was put in with too little tension.  Other complications. These are rare, but possible:  Pain in the groin or vagina.  Infection.  Excessive bleeding.  Damage to the bladder or urethra.  Nerve damage. This can cause a loss of sensation in the area.  A hole in the urethra from erosion caused by the tape. The tape must then be removed.  You may need an additional  surgery. This is rare. BEFORE THE PROCEDURE  Two weeks before your surgery, stop using aspirin and non-steroidal anti-inflammatory drugs (NSAIDs) for pain relief. This includes prescription drugs and over-the-counter drugs, such as ibuprofen and naproxen. Also stop taking vitamin E.  If you take blood-thinners, ask your caregiver when you should stop taking them.  Do not eat or drink for about 8 hours before your surgery.  You might be given a cream to place in your vagina before the procedure.  If your surgery is an outpatient procedure, you will be able to go home the same day. Make arrangements in advance for someone to drive you home. THE PROCEDURE The procedure usually takes 20 to 30 minutes.  Preparation:  The surgery can be done while you are asleep (general anesthesia) or while an area of your body is numb, but you are awake (regional anesthesia).  You will wear a surgical gown. Your groin and vaginal area will be draped with sheets.  Procedure:  The surgeon will make two small cuts (incisions) in your groin, just inside your thighs on either side of the vagina, and one in your vagina under the urethra.  The sling will then be threaded from one incision to the other, under the urethra.  A tube (catheter) will be inserted into your urethra to collect and drain urine during and after the procedure. AFTER THE PROCEDURE  The catheter will be left in place for a 2 to 3 hours. Once it is removed, you will have to urinate on your own before you can go home. An overnight stay is sometimes needed.  The surgeon [phone]  prescribe antibiotic medicines to prevent infection.  You might be given a cream to apply to the vagina to help the area heal.  It could take 2 weeks for urination to feel normal. HOME CARE INSTRUCTIONS   Take any medicine and use any creams that your caregiver prescribed. Follow the directions carefully. Take all of the medicine and use all of the cream.  Ask your  surgeon whether you can take over-the-counter medicines for pain, discomfort, or fever. Do not take aspirin unless your caregiver says that you should. Aspirin increases the chances of bleeding.  Avoid sexual intercourse, douching, using tampons, or placing anything inside your vagina for about 4 weeks. Your caregiver will let you know specifically when it is okay to do these things again.  Limit activities, such as exercising and dancing, for 4 to 6 weeks.  Keep all follow-up tests and appointments with your caregiver. Tests can show whether your urination is back to normal. SEEK MEDICAL CARE IF:   You have pain or frequent urges to urinate.  You have difficulty passing urine.  Your wounds become red or swollen, or they leak fluid or blood.  Your pain increases.  You notice unusual or bad-smelling discharge from your vagina.  You become nauseous or vomit for more than 2 days after the surgery. SEEK IMMEDIATE MEDICAL CARE IF:   You have a fever. Document Released: 04/24/2008 Document Revised: 04/20/2011 Document Reviewed: 04/24/2008 [facility] Patient Information 2015 [place], [place]. This information is not intended to replace advice given to you by your health care provider. Make sure you discuss any questions you have with your health care provider.

## 2013-08-28 ENCOUNTER — Ambulatory Visit: Payer: 59 | Admitting: Obstetrics & Gynecology

## 2013-12-11 ENCOUNTER — Encounter: Payer: Self-pay | Admitting: Obstetrics & Gynecology

## 2013-12-24 ENCOUNTER — Encounter (HOSPITAL_COMMUNITY): Payer: Self-pay

## 2013-12-24 ENCOUNTER — Emergency Department (HOSPITAL_COMMUNITY)
Admission: EM | Admit: 2013-12-24 | Discharge: 2013-12-24 | Disposition: A | Discharge status: Home / Self Care | Payer: 59 | Attending: Emergency Medicine | Admitting: Emergency Medicine

## 2013-12-24 ENCOUNTER — Emergency Department (HOSPITAL_COMMUNITY): Payer: 59

## 2013-12-24 DIAGNOSIS — R42 Dizziness and giddiness: Secondary | ICD-10-CM | POA: Diagnosis not present

## 2013-12-24 DIAGNOSIS — Z79899 Other long term (current) drug therapy: Secondary | ICD-10-CM | POA: Insufficient documentation

## 2013-12-24 DIAGNOSIS — E669 Obesity, unspecified: Secondary | ICD-10-CM | POA: Insufficient documentation

## 2013-12-24 DIAGNOSIS — J45909 Unspecified asthma, uncomplicated: Secondary | ICD-10-CM | POA: Diagnosis not present

## 2013-12-24 DIAGNOSIS — R109 Unspecified abdominal pain: Secondary | ICD-10-CM

## 2013-12-24 DIAGNOSIS — N760 Acute vaginitis: Secondary | ICD-10-CM | POA: Insufficient documentation

## 2013-12-24 DIAGNOSIS — Z9851 Tubal ligation status: Secondary | ICD-10-CM | POA: Diagnosis not present

## 2013-12-24 DIAGNOSIS — B9689 Other specified bacterial agents as the cause of diseases classified elsewhere: Secondary | ICD-10-CM

## 2013-12-24 LAB — URINE MICROSCOPIC-ADD ON

## 2013-12-24 LAB — BASIC METABOLIC PANEL
Anion gap: 11 (ref 5–15)
BUN: 10 mg/dL (ref 6–23)
CHLORIDE: 103 meq/L (ref 96–112)
CO2: 24 meq/L (ref 19–32)
Calcium: 8.9 mg/dL (ref 8.4–10.5)
Creatinine, Ser: 0.98 mg/dL (ref 0.50–1.10)
GFR calc Af Amer: 80 mL/min — ABNORMAL LOW (ref >90)
GFR calc non Af Amer: 69 mL/min — ABNORMAL LOW (ref >90)
GLUCOSE: 83 mg/dL (ref 70–99)
POTASSIUM: 3.6 meq/L — AB (ref 3.7–5.3)
SODIUM: 138 meq/L (ref 137–147)

## 2013-12-24 LAB — CBC
HCT: 38.1 % (ref 36.0–46.0)
HEMOGLOBIN: 13.1 g/dL (ref 12.0–15.0)
MCH: 33.4 pg (ref 26.0–34.0)
MCHC: 34.4 g/dL (ref 30.0–36.0)
MCV: 97.2 fL (ref 78.0–100.0)
Platelets: 328 10*3/uL (ref 150–400)
RBC: 3.92 MIL/uL (ref 3.87–5.11)
RDW: 13.2 % (ref 11.5–15.5)
WBC: 6.1 10*3/uL (ref 4.0–10.5)

## 2013-12-24 LAB — I-STAT TROPONIN, ED: TROPONIN I, POC: 0.02 ng/mL (ref 0.00–0.08)

## 2013-12-24 LAB — URINALYSIS, ROUTINE W REFLEX MICROSCOPIC
Glucose, UA: NEGATIVE mg/dL
Ketones, ur: 15 mg/dL — AB
NITRITE: NEGATIVE
Protein, ur: NEGATIVE mg/dL
Specific Gravity, Urine: 1.019 (ref 1.005–1.030)
UROBILINOGEN UA: 1 mg/dL (ref 0.0–1.0)
pH: 6 (ref 5.0–8.0)

## 2013-12-24 LAB — WET PREP, GENITAL
TRICH WET PREP: NONE SEEN
Yeast Wet Prep HPF POC: NONE SEEN

## 2013-12-24 LAB — CBG MONITORING, ED: Glucose-Capillary: 85 mg/dL (ref 70–99)

## 2013-12-24 LAB — POC URINE PREG, ED: PREG TEST UR: NEGATIVE

## 2013-12-24 MED ORDER — METRONIDAZOLE 500 MG PO TABS: 500.0000 mg | ORAL_TABLET | Freq: Two times a day (BID) | ORAL | Status: DC

## 2013-12-24 MED ORDER — MECLIZINE HCL 25 MG PO TABS
25.0000 mg | ORAL_TABLET | Freq: Once | ORAL | Status: AC
  Administered 2013-12-24: 25 mg via ORAL
  Filled 2013-12-24: qty 1

## 2013-12-24 MED ORDER — MECLIZINE HCL 25 MG PO TABS: 25.0000 mg | ORAL_TABLET | Freq: Four times a day (QID) | ORAL | Status: DC

## 2013-12-24 NOTE — ED Provider Notes (Addendum)
CSN: [id number]     Arrival date & time 12/24/13  1151 History   First MD Initiated Contact with Patient 12/24/13 1307     Chief Complaint  Patient presents with  . Dizziness  . Abdominal Pain     (Consider location/radiation/quality/duration/timing/severity/associated sxs/prior Treatment) HPI Comments: Patient is a 43 year old female with history of asthma who presents the emergency department today for intermittent dizziness since Friday. She reports that her dizziness is worse when she is changing position. The dizziness is severe and has sudden onset. By closing her eyes and standing in one spot she is able to relieve this feeling. The severe dizziness last approximately seconds to minutes at a time. She initially went to see her primary doctor for this. They diagnosed her with a urinary tract infection and told her this is why she was having the dizziness. She was started on antibiotics which she has been taking as prescribed. She denies any dysuria, urinary urgency, urinary frequency. She did have some suprapubic cramping which has resolved. She denies any fever, chills, nausea, vomiting, chest pain, shortness breath, numbness, weakness, speech difficulty. She denies any personal or family history of early heart disease, stroke.  The history is provided by the patient. No language interpreter was used.    Past Medical History  Diagnosis Date  . Obesity   . Asthma    Past Surgical History  Procedure Laterality Date  . Tubal ligation    . Mouth surgery     Family History  Problem Relation Age of Onset  . Hypertension Mother   . Kidney disease Father    History  Substance Use Topics  . Smoking status: Never Smoker   . Smokeless tobacco: Never Used  . Alcohol Use: No   OB History    Gravida Para Term Preterm AB TAB SAB Ectopic Multiple Living   2 2 2       2      Review of Systems  Constitutional: Negative for fever and chills.  Respiratory: Negative for shortness of  breath.   Cardiovascular: Negative for chest pain.  Gastrointestinal: Negative for nausea, vomiting and abdominal pain.  Genitourinary: Negative for dysuria, frequency and pelvic pain.  Neurological: Positive for dizziness. Negative for headaches.  All other systems reviewed and are negative.     Allergies  Gadolinium  Home Medications   Prior to Admission medications   Medication Sig Start Date End Date Taking? Authorizing Provider  albuterol (PROVENTIL) (2.5 MG/3ML) 0.083% nebulizer solution Take 2.5 mg by nebulization every 6 (six) hours as needed for wheezing.    Historical Provider, MD  montelukast (SINGULAIR) 10 MG tablet  11/12/12   Historical Provider, MD  Norethindrone-Ethinyl Estradiol-Fe Biphas (LO LOESTRIN FE) 1 MG-10 MCG / 10 MCG tablet Take 1 tablet by mouth daily. 01/25/13   Lahoma Crocker, MD   BP 126/78 mmHg  Pulse 84  Temp(Src) 97.9 F (36.6 C) (Oral)  Resp 14  Ht 5\' 2"  (1.575 m)  Wt 187 lb (84.823 kg)  BMI 34.19 kg/m2  SpO2 100%  LMP 12/09/2013 Physical Exam  Constitutional: She is oriented to person, place, and time. She appears well-developed and well-nourished. No distress.  HENT:  Head: Normocephalic and atraumatic.  Right Ear: External ear normal.  Left Ear: External ear normal.  Nose: Nose normal.  Mouth/Throat: Uvula is midline and oropharynx is clear and moist.  Eyes: Conjunctivae and EOM are normal. Pupils are equal, round, and reactive to light.  Neck: Normal range of motion. No  spinous process tenderness present. No rigidity.  Cardiovascular: Normal rate, regular rhythm, normal heart sounds, intact distal pulses and normal pulses.   Pulses:      Radial pulses are 2+ on the right side, and 2+ on the left side.       Posterior tibial pulses are 2+ on the right side, and 2+ on the left side.  Pulmonary/Chest: Effort normal and breath sounds normal. No stridor. No respiratory distress. She has no wheezes. She has no rales.  Abdominal: Soft.  She exhibits no distension. There is no tenderness. There is no rigidity, no rebound and no guarding.  No tenderness to palpation of the abdomen. Patient reports pain in right pelvis.   Genitourinary: There is no rash, tenderness or lesion on the right labia. There is no rash, tenderness or lesion on the left labia. Cervix exhibits discharge and friability. Cervix exhibits no motion tenderness. Right adnexum displays no mass and no tenderness. Left adnexum displays no mass and no tenderness. No erythema in the vagina. No foreign body around the vagina. No signs of injury around the vagina. Vaginal discharge found.  Cervix was mildly friable. Minimal mucoid discharge.   Musculoskeletal: Normal range of motion.  Moves all extremities without guarding or ataxia.   Neurological: She is alert and oriented to person, place, and time. She has normal strength. No sensory deficit. Coordination normal. GCS eye subscore is 4. GCS verbal subscore is 5. GCS motor subscore is 6.  Finger nose finger normal, rapid alternating movements normal, heel knee shin normal. Grip strength 5/5 bilaterally No facial droop No slurred speech  Skin: Skin is warm and dry. She is not diaphoretic. No erythema.  Psychiatric: She has a normal mood and affect. Her behavior is normal.  Nursing note and vitals reviewed.   ED Course  Procedures (including critical care time) Labs Review Labs Reviewed  BASIC METABOLIC PANEL - Abnormal; Notable for the following:    Potassium 3.6 (*)    GFR calc non Af Amer 69 (*)    GFR calc Af Amer 80 (*)    All other components within normal limits  URINALYSIS, ROUTINE W REFLEX MICROSCOPIC - Abnormal; Notable for the following:    Color, Urine AMBER (*)    APPearance CLOUDY (*)    Hgb urine dipstick TRACE (*)    Bilirubin Urine SMALL (*)    Ketones, ur 15 (*)    Leukocytes, UA MODERATE (*)    All other components within normal limits  URINE MICROSCOPIC-ADD ON - Abnormal; Notable for the  following:    Squamous Epithelial / LPF FEW (*)    Casts HYALINE CASTS (*)    Crystals CA OXALATE CRYSTALS (*)    All other components within normal limits  CBC  I-STAT TROPOININ, ED  POC URINE PREG, ED  CBG MONITORING, ED    Imaging Review Dg Chest 2 View  12/24/2013   CLINICAL DATA:  Dizziness and shortness of breath for 2 days. History of asthma.  EXAM: CHEST  2 VIEW  COMPARISON:  01/20/2007  FINDINGS: The heart size and mediastinal contours are within normal limits. Both lungs are clear. The visualized skeletal structures are unremarkable.  IMPRESSION: No active cardiopulmonary disease.   Electronically Signed   By: Curlene Dolphin M.D.   On: 12/24/2013 13:57     EKG Interpretation None      MDM   Final diagnoses:  Abdominal pain, unspecified abdominal location  Bacterial vaginosis    Patient presents to  ED for evaluation of dizziness. Dizziness is consistent with vertigo. Worse with certain movements, resolves with staying in same position and closing eyes. Nonfocal neuro exam. Will give home rx for Antivert. Patient with pelvic pain, but no tenderness. Mild cervical friability and mucoid discharge. No CMT or concern for PID. Will give flagyl. She will follow up with her PCP. Discussed reasons to return to ED immediately. Vital signs stable for discharge. Dr. Jeanell Sparrow evaluated patient and agrees with plan. Patient / Family / Caregiver informed of clinical course, understand medical decision-making process, and agree with plan.     Elwyn Lade, PA-C 12/24/13 [place], MD 12/27/13 [place] Lionardo Haze, MD 12/27/13 [phone]

## 2013-12-24 NOTE — ED Notes (Signed)
Pelvic Cart at Bedside.

## 2013-12-24 NOTE — ED Notes (Signed)
Pt reports dizziness since Friday when she is standing and when she lays down the room starts spinning. Also reports intermittent abd pain. Nauseated no vomiting.

## 2013-12-24 NOTE — Discharge Instructions (Signed)
Vertigo Vertigo means you feel like you or your surroundings are moving when they are not. Vertigo can be dangerous if it occurs when you are at work, driving, or performing difficult activities.  CAUSES  Vertigo occurs when there is a conflict of signals sent to your brain from the visual and sensory systems in your body. There are many different causes of vertigo, including:  Infections, especially in the inner ear.  A bad reaction to a drug or misuse of alcohol and medicines.  Withdrawal from drugs or alcohol.  Rapidly changing positions, such as lying down or rolling over in bed.  A migraine headache.  Decreased blood flow to the brain.  Increased pressure in the brain from a head injury, infection, tumor, or bleeding. SYMPTOMS  You may feel as though the world is spinning around or you are falling to the ground. Because your balance is upset, vertigo can cause nausea and vomiting. You may have involuntary eye movements (nystagmus). DIAGNOSIS  Vertigo is usually diagnosed by physical exam. If the cause of your vertigo is unknown, your caregiver may perform imaging tests, such as an MRI scan (magnetic resonance imaging). TREATMENT  Most cases of vertigo resolve on their own, without treatment. Depending on the cause, your caregiver may prescribe certain medicines. If your vertigo is related to body position issues, your caregiver may recommend movements or procedures to correct the problem. In rare cases, if your vertigo is caused by certain inner ear problems, you may need surgery. HOME CARE INSTRUCTIONS   Follow your caregiver's instructions.  Avoid driving.  Avoid operating heavy machinery.  Avoid performing any tasks that would be dangerous to you or others during a vertigo episode.  Tell your caregiver if you notice that certain medicines seem to be causing your vertigo. Some of the medicines used to treat vertigo episodes can actually make them worse in some people. SEEK  IMMEDIATE MEDICAL CARE IF:   Your medicines do not relieve your vertigo or are making it worse.  You develop problems with talking, walking, weakness, or using your arms, hands, or legs.  You develop severe headaches.  Your nausea or vomiting continues or gets worse.  You develop visual changes.  A family member notices behavioral changes.  Your condition gets worse. MAKE SURE YOU:  Understand these instructions.  Will watch your condition.  Will get help right away if you are not doing well or get worse. Document Released: 11/05/2004 Document Revised: 04/20/2011 Document Reviewed: 08/14/2010 [facility] Patient Information 2015 [place], [place]. This information is not intended to replace advice given to you by your health care provider. Make sure you discuss any questions you have with your health care provider.   Bacterial Vaginosis Bacterial vaginosis is an infection of the vagina. It happens when too many of certain germs (bacteria) grow in the vagina. HOME CARE  Take your medicine as told by your doctor.  Finish your medicine even if you start to feel better.  Do not have sex until you finish your medicine and are better.  Tell your sex partner that you have an infection. They should see their doctor for treatment.  Practice safe sex. Use condoms. Have only one sex partner. GET HELP IF:  You are not getting better after 3 days of treatment.  You have more grey fluid (discharge) coming from your vagina than before.  You have more pain than before.  You have a fever. MAKE SURE YOU:   Understand these instructions.  Will watch your  condition.  Will get help right away if you are not doing well or get worse. Document Released: 11/05/2007 Document Revised: 11/16/2012 Document Reviewed: 09/07/2012 [facility] Patient Information 2015 [place], [place]. This information is not intended to replace advice given to you by your health care provider. Make sure you discuss any  questions you have with your health care provider.

## 2013-12-25 LAB — GC/CHLAMYDIA PROBE AMP
CT Probe RNA: NEGATIVE
GC PROBE AMP APTIMA: NEGATIVE

## 2014-02-05 ENCOUNTER — Encounter: Payer: Self-pay | Admitting: *Deleted

## 2014-02-06 ENCOUNTER — Encounter: Payer: Self-pay | Admitting: Obstetrics & Gynecology

## 2014-03-21 ENCOUNTER — Telehealth: Payer: Self-pay | Admitting: *Deleted

## 2014-03-21 DIAGNOSIS — R102 Pelvic and perineal pain: Secondary | ICD-10-CM

## 2014-03-21 NOTE — Telephone Encounter (Addendum)
Ultrasound scheduled 04/03/14 @ 1015 am.  Attempted to contact patient, no answer, left message for patient to contact clinic and leave a message of the best time to reach her and the best number to call.  Pt needs to be informed of referral and of ultrasound appointment on 04/03/14 @ 1015, front office will call her with an appointment with provider.  2/16  1120  Called and spoke w/pt. I informed her of [place] appt on 2/23 @ 1015 and that she will need to have a full bladder.  Pt voiced understanding and agreed to appt.  Diane Day RNC

## 2014-03-28 ENCOUNTER — Other Ambulatory Visit: Payer: Self-pay

## 2014-03-28 DIAGNOSIS — Z1231 Encounter for screening mammogram for malignant neoplasm of breast: Secondary | ICD-10-CM

## 2014-03-29 ENCOUNTER — Encounter: Payer: Self-pay | Admitting: *Deleted

## 2014-04-03 ENCOUNTER — Other Ambulatory Visit: Payer: Self-pay | Admitting: Obstetrics & Gynecology

## 2014-04-03 ENCOUNTER — Ambulatory Visit (HOSPITAL_COMMUNITY)
Admission: RE | Admit: 2014-04-03 | Discharge: 2014-04-03 | Disposition: A | Discharge status: Home / Self Care | Payer: 59 | Source: Ambulatory Visit | Attending: Obstetrics & Gynecology | Admitting: Obstetrics & Gynecology

## 2014-04-03 DIAGNOSIS — R102 Pelvic and perineal pain: Secondary | ICD-10-CM | POA: Insufficient documentation

## 2014-04-04 ENCOUNTER — Telehealth: Payer: Self-pay | Admitting: General Practice

## 2014-04-04 NOTE — Telephone Encounter (Signed)
-----   Message from Osborne Oman, MD sent at 04/04/2014 11:35 AM EST ----- Normal pelvic ultrasound, no GYN etiology seen for her pain.  Please call to inform patient of results.

## 2014-04-04 NOTE — Telephone Encounter (Signed)
Called patient and informed her of results. Patient verbalized understanding and had no questions. 

## 2014-04-17 ENCOUNTER — Ambulatory Visit (INDEPENDENT_AMBULATORY_CARE_PROVIDER_SITE_OTHER): Payer: 59 | Admitting: Obstetrics

## 2014-04-17 ENCOUNTER — Encounter: Payer: Self-pay | Admitting: Obstetrics

## 2014-04-17 VITALS — BP 140/93 | HR 89 | Temp 98.6°F | Ht 62.0 in | Wt 195.0 lb

## 2014-04-17 DIAGNOSIS — N951 Menopausal and female climacteric states: Secondary | ICD-10-CM

## 2014-04-17 DIAGNOSIS — N393 Stress incontinence (female) (male): Secondary | ICD-10-CM

## 2014-04-17 DIAGNOSIS — Z124 Encounter for screening for malignant neoplasm of cervix: Secondary | ICD-10-CM

## 2014-04-17 DIAGNOSIS — Z01419 Encounter for gynecological examination (general) (routine) without abnormal findings: Secondary | ICD-10-CM | POA: Diagnosis not present

## 2014-04-17 NOTE — Progress Notes (Signed)
Subjective:        Robin Fleming is a 44 y.o. female here for a routine exam.  Current complaints: Hot flashes.  On Lo Loestrin for the vasomotor symptoms, but has had severe headaches and visual changes over the past year.  Stopped taking OCP's this past November.  MRI done over the past year for the headaches was negative.    Personal health questionnaire:  Is patient [name], have a family history of breast and/or ovarian cancer: no Is there a family history of uterine cancer diagnosed at age < 93, gastrointestinal cancer, urinary tract cancer, family member who is a Field seismologist syndrome-associated carrier: no Is the patient overweight and hypertensive, family history of diabetes, personal history of gestational diabetes, preeclampsia or PCOS: no Is patient over 95, have PCOS,  family history of premature CHD under age 38, diabetes, smoke, have hypertension or peripheral artery disease:  no At any time, has a partner hit, kicked or otherwise hurt or frightened you?: no Over the past 2 weeks, have you felt down, depressed or hopeless?: no Over the past 2 weeks, have you felt little interest or pleasure in doing things?:no   Gynecologic History No LMP recorded. Patient is not currently having periods (Reason: Irregular Periods). Contraception: Tubal Ligation Last Pap: 2014. Results were: normal Last mammogram: 2014. Results were: normal  Obstetric History OB History  Gravida Para Term Preterm AB SAB TAB Ectopic Multiple Living  2 2 2       2     # Outcome Date GA Lbr Len/2nd Weight Sex Delivery Anes PTL Lv  2 Term         Y  1 Term         Y      Past Medical History  Diagnosis Date  . Obesity   . Asthma     Past Surgical History  Procedure Laterality Date  . Tubal ligation    . Mouth surgery       Current outpatient prescriptions:  .  albuterol (PROVENTIL HFA;VENTOLIN HFA) 108 (90 BASE) MCG/ACT inhaler, Inhale 2 puffs into the lungs every 6 (six) hours as needed  for wheezing or shortness of breath., Disp: , Rfl:  .  albuterol (PROVENTIL) (2.5 MG/3ML) 0.083% nebulizer solution, Take 2.5 mg by nebulization every 6 (six) hours as needed for wheezing., Disp: , Rfl:  .  montelukast (SINGULAIR) 10 MG tablet, Take 10 mg by mouth daily as needed (for allergies). , Disp: , Rfl:  .  meclizine (ANTIVERT) 25 MG tablet, Take 1 tablet (25 mg total) by mouth 4 (four) times daily. (Patient not taking: Reported on 04/17/2014), Disp: 28 tablet, Rfl: 0 .  metroNIDAZOLE (FLAGYL) 500 MG tablet, Take 1 tablet (500 mg total) by mouth 2 (two) times daily. One po bid x 7 days (Patient not taking: Reported on 04/17/2014), Disp: 14 tablet, Rfl: 0 .  Norethindrone-Ethinyl Estradiol-Fe Biphas (LO LOESTRIN FE) 1 MG-10 MCG / 10 MCG tablet, Take 1 tablet by mouth daily. (Patient not taking: Reported on 04/17/2014), Disp: 1 Package, Rfl: 11 Allergies  Allergen Reactions  . Gadolinium      Desc: Pt developed nausea and vomiting directly after hand injecting 17cc Multihance for MRI Brain.     History  Substance Use Topics  . Smoking status: Never Smoker   . Smokeless tobacco: Never Used  . Alcohol Use: No    Family History  Problem Relation Age of Onset  . Hypertension Mother   . Kidney  disease Father       Review of Systems  Constitutional: negative for fatigue and weight loss Respiratory: negative for cough and wheezing Cardiovascular: negative for chest pain, fatigue and palpitations Gastrointestinal: negative for abdominal pain and change in bowel habits Musculoskeletal:negative for myalgias Neurological: negative for gait problems and tremors Behavioral/Psych: negative for abusive relationship, depression Endocrine: negative for temperature intolerance   Genitourinary:negative for abnormal menstrual periods, genital lesions, sexual problems and vaginal discharge.  Positive for hot flashes, SUI Integument/breast: negative for breast lump, breast tenderness, nipple discharge  and skin lesion(s)    Objective:       BP 140/93 mmHg  Pulse 89  Temp(Src) 98.6 F (37 C)  Ht 5\' 2"  (1.575 m)  Wt 195 lb (88.451 kg)  BMI 35.66 kg/m2 General:   alert  Skin:   no rash or abnormalities  Lungs:   clear to auscultation bilaterally  Heart:   regular rate and rhythm, S1, S2 normal, no murmur, click, rub or gallop  Breasts:   normal without suspicious masses, skin or nipple changes or axillary nodes  Abdomen:  normal findings: no organomegaly, soft, non-tender and no hernia  Pelvis:  External genitalia: normal general appearance Urinary system: urethral meatus normal and bladder without fullness, nontender Vaginal: normal without tenderness, induration or masses Cervix: normal appearance Adnexa: normal bimanual exam Uterus: anteverted and non-tender, normal size   Lab Review Urine pregnancy test Labs reviewed yes Radiologic studies reviewed yes    Assessment:    Healthy female exam.    Perimenopause.  Symptomatic.  Contraceptive management.  HA's and visual changes on OCP's.   Plan:   Stop OCP's because H/O severe HA's. Consider non hormonal contraceptive methods.   Education reviewed: calcium supplements, low fat, low cholesterol diet, self breast exams and weight bearing exercise. Contraception: Nonhormonal methods recommended, i.e. ParaGuard IUD.. Mammogram ordered. Follow up in: 3 months.   No orders of the defined types were placed in this encounter.   No orders of the defined types were placed in this encounter.

## 2014-04-17 NOTE — Addendum Note (Signed)
Addended by: Ladona Ridgel on: 04/17/2014 03:02 PM   Modules accepted: Orders

## 2014-04-18 ENCOUNTER — Telehealth: Payer: Self-pay

## 2014-04-18 NOTE — Telephone Encounter (Signed)
called patient regarding collection balance - billing cannot find where pmt was posted - she needs to get receipt prior to next visit

## 2014-04-18 NOTE — Telephone Encounter (Signed)
Sonia Baller from [employer] stated balance was paid 04/06/14 by credit card - [facility] should receive invoice on 04/10/14 [facility]

## 2014-04-19 LAB — PAP IG AND HPV HIGH-RISK: HPV DNA High Risk: NOT DETECTED

## 2014-04-20 ENCOUNTER — Other Ambulatory Visit: Payer: Self-pay | Admitting: Obstetrics

## 2014-04-20 DIAGNOSIS — B9689 Other specified bacterial agents as the cause of diseases classified elsewhere: Secondary | ICD-10-CM

## 2014-04-20 DIAGNOSIS — N760 Acute vaginitis: Principal | ICD-10-CM

## 2014-04-20 LAB — SURESWAB, VAGINOSIS/VAGINITIS PLUS
Atopobium vaginae: 6.7 Log (cells/mL)
C. ALBICANS, DNA: NOT DETECTED
C. GLABRATA, DNA: NOT DETECTED
C. PARAPSILOSIS, DNA: NOT DETECTED
C. TRACHOMATIS RNA, TMA: NOT DETECTED
C. tropicalis, DNA: NOT DETECTED
Gardnerella vaginalis: >8 Log (cells/mL)
LACTOBACILLUS SPECIES: NOT DETECTED Log (cells/mL)
MEGASPHAERA SPECIES: 7 Log (cells/mL)
N. gonorrhoeae RNA, TMA: NOT DETECTED
T. vaginalis RNA, QL TMA: NOT DETECTED

## 2014-04-20 MED ORDER — TINIDAZOLE 500 MG PO TABS: 1000.0000 mg | ORAL_TABLET | Freq: Every day | ORAL | Status: DC

## 2014-04-23 ENCOUNTER — Other Ambulatory Visit: Payer: Self-pay | Admitting: *Deleted

## 2014-04-23 DIAGNOSIS — N760 Acute vaginitis: Principal | ICD-10-CM

## 2014-04-23 DIAGNOSIS — B9689 Other specified bacterial agents as the cause of diseases classified elsewhere: Secondary | ICD-10-CM

## 2014-04-23 MED ORDER — METRONIDAZOLE 500 MG PO TABS: 500.0000 mg | ORAL_TABLET | Freq: Two times a day (BID) | ORAL | Status: DC

## 2014-04-23 NOTE — Progress Notes (Signed)
Rx change to Metronidazole due to insurance coverage.  [employer] has Tinidazole on a higher tier.

## 2014-05-08 ENCOUNTER — Ambulatory Visit
Admission: RE | Admit: 2014-05-08 | Discharge: 2014-05-08 | Disposition: A | Discharge status: Home / Self Care | Payer: 59 | Source: Ambulatory Visit

## 2014-05-08 ENCOUNTER — Encounter: Payer: Self-pay | Admitting: *Deleted

## 2014-05-08 DIAGNOSIS — Z1231 Encounter for screening mammogram for malignant neoplasm of breast: Secondary | ICD-10-CM

## 2014-06-01 ENCOUNTER — Encounter: Payer: 59 | Admitting: Physician Assistant

## 2014-07-18 ENCOUNTER — Ambulatory Visit: Payer: 59 | Admitting: Obstetrics

## 2014-07-24 ENCOUNTER — Ambulatory Visit (INDEPENDENT_AMBULATORY_CARE_PROVIDER_SITE_OTHER): Payer: 59 | Admitting: Obstetrics

## 2014-07-24 ENCOUNTER — Encounter: Payer: Self-pay | Admitting: Obstetrics

## 2014-07-24 VITALS — BP 125/88 | HR 76 | Temp 98.1°F | Ht 62.0 in | Wt 195.0 lb

## 2014-07-24 DIAGNOSIS — N951 Menopausal and female climacteric states: Secondary | ICD-10-CM | POA: Diagnosis not present

## 2014-07-24 NOTE — Patient Instructions (Signed)

## 2014-07-24 NOTE — Progress Notes (Addendum)
Patient ID: Robin Fleming, female   DOB: Feb 11, 1970, 44 y.o.   MRN: [id number]    HPI Robin Fleming is a 44 y.o. female.  Presents for F/U of symptomatic menopausal symptoms of hot flashes.  Also has had HA's and was on low dose OCP's for source of estrogen for hot flashes, but HA's got worse and we stopped the OCP's.  HA's are now better and she gets relief from HA's with Alleve.  She would still like to take something for the hot flashes. HPI  Past Medical History  Diagnosis Date  . Obesity   . Asthma     Past Surgical History  Procedure Laterality Date  . Tubal ligation    . Mouth surgery      Family History  Problem Relation Age of Onset  . Hypertension Mother   . Kidney disease Father     Social History History  Substance Use Topics  . Smoking status: Never Smoker   . Smokeless tobacco: Never Used  . Alcohol Use: No    Allergies  Allergen Reactions  . Gadolinium      Desc: Pt developed nausea and vomiting directly after hand injecting 17cc Multihance for MRI Brain.     Current Outpatient Prescriptions  Medication Sig Dispense Refill  . albuterol (PROVENTIL HFA;VENTOLIN HFA) 108 (90 BASE) MCG/ACT inhaler Inhale 2 puffs into the lungs every 6 (six) hours as needed for wheezing or shortness of breath.    Marland Kitchen albuterol (PROVENTIL) (2.5 MG/3ML) 0.083% nebulizer solution Take 2.5 mg by nebulization every 6 (six) hours as needed for wheezing.    . montelukast (SINGULAIR) 10 MG tablet Take 10 mg by mouth daily as needed (for allergies).     . hydrochlorothiazide (HYDRODIURIL) 12.5 MG tablet Take 12.5 mg by mouth daily.  2  . meclizine (ANTIVERT) 25 MG tablet Take 1 tablet (25 mg total) by mouth 4 (four) times daily. (Patient not taking: Reported on 04/17/2014) 28 tablet 0  . metroNIDAZOLE (FLAGYL) 500 MG tablet Take 1 tablet (500 mg total) by mouth 2 (two) times daily. (Patient not taking: Reported on 07/24/2014) 14 tablet 0   No current facility-administered medications for this  visit.    Review of Systems Review of Systems Constitutional: negative for fatigue and weight loss Respiratory: negative for cough and wheezing Cardiovascular: negative for chest pain, fatigue and palpitations Gastrointestinal: negative for abdominal pain and change in bowel habits Genitourinary:negative Integument/breast: negative for nipple discharge Musculoskeletal:negative for myalgias Neurological: negative for gait problems and tremors Behavioral/Psych: negative for abusive relationship, depression Endocrine: negative for temperature intolerance     Blood pressure 125/88, pulse 76, temperature 98.1 F (36.7 C), height 5\' 2"  (1.575 m), weight 195 lb (88.451 kg).  Physical Exam Physical Exam:  Deferred  100% of 10 min visit spent on counseling and coordination of care.   Data Reviewed Labs Meds  Assessment     Perimenopause, symptomatic.     Plan    Recommended consultation for trial of some of the herbal supplements for menopausal symptoms.  No hormones. Continue Alleve for HA's F/U 1 year  No orders of the defined types were placed in this encounter.   Meds ordered this encounter  Medications  . hydrochlorothiazide (HYDRODIURIL) 12.5 MG tablet    Sig: Take 12.5 mg by mouth daily.    Refill:  2

## 2014-09-18 ENCOUNTER — Ambulatory Visit (HOSPITAL_BASED_OUTPATIENT_CLINIC_OR_DEPARTMENT_OTHER): Payer: 59 | Attending: Internal Medicine | Admitting: Radiology

## 2014-09-18 VITALS — Ht 62.0 in | Wt 192.0 lb

## 2014-09-18 DIAGNOSIS — R0683 Snoring: Secondary | ICD-10-CM | POA: Diagnosis not present

## 2014-09-18 DIAGNOSIS — G4733 Obstructive sleep apnea (adult) (pediatric): Secondary | ICD-10-CM | POA: Insufficient documentation

## 2014-09-18 DIAGNOSIS — I10 Essential (primary) hypertension: Secondary | ICD-10-CM | POA: Diagnosis present

## 2014-09-22 DIAGNOSIS — G4733 Obstructive sleep apnea (adult) (pediatric): Secondary | ICD-10-CM | POA: Diagnosis not present

## 2014-09-22 NOTE — Progress Notes (Signed)
     Patient Name: Robin Fleming, Robin Fleming Date: 09/18/2014 Gender: Female D.O.B: [birth year] Age (years): 43 Referring Provider: Nolene Ebbs Height (inches): 62 Interpreting Physician: Baird Lyons MD, ABSM Weight (lbs): 192 RPSGT: Carolin Coy BMI: 35 MRN: [id number] Neck Size: 12.00 CLINICAL INFORMATION Sleep Study Type:NPSG  Indication for sleep study: Hypertension, Morning Headaches, Obesity  Epworth Sleepiness Score: 9  SLEEP STUDY TECHNIQUE As per the AASM Manual for the Scoring of Sleep and Associated Events v2.3 (April 2016) with a hypopnea requiring 4% desaturations.  The channels recorded and monitored were frontal, central and occipital EEG, electrooculogram (EOG), submentalis EMG (chin), nasal and oral airflow, thoracic and abdominal wall motion, anterior tibialis EMG, snore microphone, electrocardiogram, and pulse oximetry. Continuous positive airway pressure (CPAP) was initiated when the patient met split night criteria and was titrated according to treat sleep-disordered breathing.  MEDICATIONS Medications taken by the patient : Charted for review Medications administered by patient during sleep study : No sleep medicine administered.  RESPIRATORY PARAMETERS Diagnostic  Total AHI (/hr): 7.7 RDI (/hr): 9.3 OA Index (/hr): 2.5 CA Index (/hr): 0.0 REM AHI (/hr): 32.2 NREM AHI (/hr): 1.6 Supine AHI (/hr): 26.0 Non-supine AHI (/hr): 1.67 Min O2 Sat (%): 82.00 Mean O2 (%): 96.25 Time below 88% (min): 1.2      SLEEP ARCHITECTURE The recording time for the entire night was 433.9 minutes.  During a baseline period of 433.9 minutes, the patient slept for 382.0 minutes in REM and nonREM, yielding a sleep efficiency of 88.0%. Sleep onset after lights out was 9.9 minutes with a REM latency of 115.5 minutes. The patient spent 5.50% of the night in stage N1 sleep, 64.92% in stage N2 sleep, 9.55% in stage N3 and 20.03% in REM.  CARDIAC DATA The 2 lead EKG demonstrated  sinus rhythm. The mean heart rate was 75.17 beats per minute. Other EKG findings include: None.  LEG MOVEMENT DATA The total Periodic Limb Movements of Sleep (PLMS) were 4. The PLMS index was 0.63 .  IMPRESSIONS Mild obstructive sleep apnea occurred during the diagnostic portion of the study (AHI = 7.7 /hour). There were not enough early events to qualify for split protocol CPAP titration.. No significant central sleep apnea occurred during the diagnostic portion of the study (CAI = 0.0/hour). The patient snored with Moderate snoring volume during the diagnostic portion of the study. No cardiac abnormalities were noted during this study. Clinically significant periodic limb movements did not occur during sleep.  DIAGNOSIS Obstructive Sleep Apnea (327.23 [G47.33 ICD-10])  RECOMMENDATIONS The patient can return for dedicated CPAP titration iif appropriate. Avoid alcohol, sedatives and other CNS depressants that may worsen sleep apnea and disrupt normal sleep architecture. Sleep hygiene should be reviewed to assess factors that may improve sleep quality. Weight management and regular exercise should be initiated or continued.    Deneise Lever Diplomate, American Board of Sleep Medicine  ELECTRONICALLY SIGNED ON:  09/22/2014, 10:31 AM [place] PH: [phone]   FX: [phone] [place]

## 2015-01-28 ENCOUNTER — Encounter (HOSPITAL_COMMUNITY): Payer: Self-pay | Admitting: Emergency Medicine

## 2015-01-28 ENCOUNTER — Emergency Department (HOSPITAL_COMMUNITY)
Admission: EM | Admit: 2015-01-28 | Discharge: 2015-01-28 | Disposition: A | Discharge status: Home / Self Care | Payer: 59 | Attending: Emergency Medicine | Admitting: Emergency Medicine

## 2015-01-28 DIAGNOSIS — J45909 Unspecified asthma, uncomplicated: Secondary | ICD-10-CM | POA: Diagnosis not present

## 2015-01-28 DIAGNOSIS — R42 Dizziness and giddiness: Secondary | ICD-10-CM | POA: Diagnosis not present

## 2015-01-28 DIAGNOSIS — R110 Nausea: Secondary | ICD-10-CM | POA: Insufficient documentation

## 2015-01-28 DIAGNOSIS — E669 Obesity, unspecified: Secondary | ICD-10-CM | POA: Insufficient documentation

## 2015-01-28 DIAGNOSIS — Z3202 Encounter for pregnancy test, result negative: Secondary | ICD-10-CM | POA: Insufficient documentation

## 2015-01-28 DIAGNOSIS — Z79899 Other long term (current) drug therapy: Secondary | ICD-10-CM | POA: Insufficient documentation

## 2015-01-28 LAB — URINE MICROSCOPIC-ADD ON

## 2015-01-28 LAB — CBC WITH DIFFERENTIAL/PLATELET
Basophils Absolute: 0 10*3/uL (ref 0.0–0.1)
Basophils Relative: 0 %
EOS PCT: 3 %
Eosinophils Absolute: 0.2 10*3/uL (ref 0.0–0.7)
HCT: 41 % (ref 36.0–46.0)
Hemoglobin: 13.6 g/dL (ref 12.0–15.0)
LYMPHS ABS: 2.5 10*3/uL (ref 0.7–4.0)
Lymphocytes Relative: 42 %
MCH: 32.2 pg (ref 26.0–34.0)
MCHC: 33.2 g/dL (ref 30.0–36.0)
MCV: 96.9 fL (ref 78.0–100.0)
MONO ABS: 0.4 10*3/uL (ref 0.1–1.0)
MONOS PCT: 6 %
Neutro Abs: 2.9 10*3/uL (ref 1.7–7.7)
Neutrophils Relative %: 49 %
PLATELETS: 270 10*3/uL (ref 150–400)
RBC: 4.23 MIL/uL (ref 3.87–5.11)
RDW: 14 % (ref 11.5–15.5)
WBC: 5.9 10*3/uL (ref 4.0–10.5)

## 2015-01-28 LAB — I-STAT TROPONIN, ED: Troponin i, poc: 0 ng/mL (ref 0.00–0.08)

## 2015-01-28 LAB — URINALYSIS, ROUTINE W REFLEX MICROSCOPIC
BILIRUBIN URINE: NEGATIVE
Glucose, UA: NEGATIVE mg/dL
HGB URINE DIPSTICK: NEGATIVE
Ketones, ur: NEGATIVE mg/dL
Nitrite: NEGATIVE
PH: 5.5 (ref 5.0–8.0)
Protein, ur: NEGATIVE mg/dL
SPECIFIC GRAVITY, URINE: 1.01 (ref 1.005–1.030)

## 2015-01-28 LAB — POC URINE PREG, ED: Preg Test, Ur: NEGATIVE

## 2015-01-28 MED ORDER — MECLIZINE HCL 25 MG PO TABS
25.0000 mg | ORAL_TABLET | Freq: Once | ORAL | Status: AC
  Administered 2015-01-28: 25 mg via ORAL
  Filled 2015-01-28: qty 1

## 2015-01-28 MED ORDER — MECLIZINE HCL 25 MG PO TABS: 25.0000 mg | ORAL_TABLET | Freq: Two times a day (BID) | ORAL | Status: DC | PRN

## 2015-01-28 MED ORDER — SODIUM CHLORIDE 0.9 % IV BOLUS (SEPSIS)
1000.0000 mL | Freq: Once | INTRAVENOUS | Status: AC
  Administered 2015-01-28: 1000 mL via INTRAVENOUS

## 2015-01-28 NOTE — ED Notes (Signed)
Pt from home for eval of constant dizziness that started yesterday morning, pt states she was sleeping when she got dizzy and woke up. Reports some nausea but denies any v/d or fevers. Pt denies any numbness or weakness to extremities but reports when she sleeps on her left side the dizziness gets worse or with position changes, denies any tinnitus or blurred vision. nad noted, axox 4. No neuro deficits noted.

## 2015-01-28 NOTE — ED Provider Notes (Signed)
CSN: [id number]     Arrival date & time 01/28/15  0905 History   First MD Initiated Contact with Patient 01/28/15 1003     Chief Complaint  Patient presents with  . Dizziness  HPI   Robin Fleming is a 44 y.o. F PMH significant for asthma, obesity presenting with a 1 day history of constant dizziness. She states she woke up with room-spinning dizziness that becomes worse when she lays on her left side or with position changes. She endorses nausea. She has presented to this ED for similar last year, was given meclizine, and this relieved her symptoms. She did not follow-up with ENT or refill her prescription. No fevers, palpitations, CP, SOB, abdominal pain, V/D.    Past Medical History  Diagnosis Date  . Obesity   . Asthma    Past Surgical History  Procedure Laterality Date  . Tubal ligation    . Mouth surgery     Family History  Problem Relation Age of Onset  . Hypertension Mother   . Kidney disease Father    Social History  Substance Use Topics  . Smoking status: Never Smoker   . Smokeless tobacco: Never Used  . Alcohol Use: No   OB History    Gravida Para Term Preterm AB TAB SAB Ectopic Multiple Living   2 2 2       2      Review of Systems  Ten systems are reviewed and are negative for acute change except as noted in the HPI  Allergies  Gadolinium  Home Medications   Prior to Admission medications   Medication Sig Start Date End Date Taking? Authorizing Provider  albuterol (PROVENTIL HFA;VENTOLIN HFA) 108 (90 BASE) MCG/ACT inhaler Inhale 2 puffs into the lungs every 6 (six) hours as needed for wheezing or shortness of breath.   Yes Historical Provider, MD  albuterol (PROVENTIL) (2.5 MG/3ML) 0.083% nebulizer solution Take 2.5 mg by nebulization every 6 (six) hours as needed for wheezing.   Yes Historical Provider, MD  Cholecalciferol (VITAMIN D-3 PO) Take 2 tablets by mouth daily.   Yes Historical Provider, MD  CRANBERRY PO Take 1 tablet by mouth daily.   Yes  Historical Provider, MD  Cyanocobalamin (VITAMIN B-12 PO) Take 2 tablets by mouth daily.   Yes Historical Provider, MD  hydrochlorothiazide (HYDRODIURIL) 12.5 MG tablet Take 12.5 mg by mouth daily. 06/19/14  Yes Historical Provider, MD  montelukast (SINGULAIR) 10 MG tablet Take 10 mg by mouth daily as needed (for allergies).  11/12/12  Yes Historical Provider, MD  Multiple Vitamins-Minerals (MULTIVITAMIN PO) Take 1 tablet by mouth daily.   Yes Historical Provider, MD  QVAR 80 MCG/ACT inhaler Inhale 2 puffs into the lungs 2 (two) times daily. 01/18/15  Yes Historical Provider, MD   BP 135/91 mmHg  Pulse 81  Temp(Src) 98 F (36.7 C) (Oral)  Resp 16  Ht 5\' 2"  (1.575 m)  Wt 86.183 kg  BMI 34.74 kg/m2  SpO2 100%  LMP  (LMP Unknown) Physical Exam  Constitutional: She is oriented to person, place, and time. She appears well-developed and well-nourished. No distress.  HENT:  Head: Normocephalic and atraumatic.  Right Ear: External ear normal.  Left Ear: External ear normal.  Nose: Nose normal.  Mouth/Throat: Oropharynx is clear and moist. No oropharyngeal exudate.  Eyes: Conjunctivae and EOM are normal. Pupils are equal, round, and reactive to light. Right eye exhibits no discharge. Left eye exhibits no discharge. No scleral icterus.  Neck: Normal range  of motion. No tracheal deviation present.  Cardiovascular: Normal rate, regular rhythm, normal heart sounds and intact distal pulses.  Exam reveals no gallop and no friction rub.   No murmur heard. Pulmonary/Chest: Effort normal and breath sounds normal. No respiratory distress. She has no wheezes. She has no rales. She exhibits no tenderness.  Abdominal: Soft. Bowel sounds are normal. She exhibits no distension and no mass. There is no tenderness. There is no rebound and no guarding.  Musculoskeletal: She exhibits no edema.  Lymphadenopathy:    She has no cervical adenopathy.  Neurological: She is alert and oriented to person, place, and time.  No cranial nerve deficit. Coordination normal.  Skin: Skin is warm and dry. No rash noted. She is not diaphoretic. No erythema.  Psychiatric: She has a normal mood and affect. Her behavior is normal.  Nursing note and vitals reviewed.   ED Course  Procedures Labs Review Labs Reviewed  URINALYSIS, ROUTINE W REFLEX MICROSCOPIC (NOT AT [facility]) - Abnormal; Notable for the following:    APPearance CLOUDY (*)    Leukocytes, UA SMALL (*)    All other components within normal limits  URINE MICROSCOPIC-ADD ON - Abnormal; Notable for the following:    Squamous Epithelial / LPF TOO NUMEROUS TO COUNT (*)    Bacteria, UA FEW (*)    All other components within normal limits  CBC WITH DIFFERENTIAL/PLATELET  Randolm Idol, ED  POC URINE PREG, ED     EKG Interpretation   Date/Time:  Monday January 28 2015 11:05:03 EST Ventricular Rate:  81 PR Interval:  150 QRS Duration: 96 QT Interval:  411 QTC Calculation: 477 R Axis:   -11 Text Interpretation:  Sinus rhythm normal, no change from ols Confirmed by  Johnney Killian, MD, Jeannie Done [phone]) on 01/28/2015 11:31:21 AM      MDM   Final diagnoses:  None   Patient non-toxic appearing and VSS. Neuro exam unremarkable. Dizziness most consistent with vertigo, but will perform cardiac vs infectious workup.  UA contaminated. EKG, CBC, troponin, hcg negative.   Went to reevaluate patient, and she is eating a sub. She is feeling better. Patient may be safely discharged home. Discussed reasons for return. Patient to follow-up with primary care provider or ENT for further non-emergent workup. Patient in understanding and agreement with the plan.    [place], PA-C 01/31/15 1334  Charlesetta Shanks, MD 02/16/15 [phone]

## 2015-01-28 NOTE — Discharge Instructions (Signed)
Ms. SKYLUR ZOPPI,  Nice meeting you! Follow-up with Ear, Nose, and Throat regarding your vertigo. Return to the emergency department if you develop fevers, are unable to keep foods down, your dizziness worsens. Feel better soon!  S. Wendie Simmer, PA-C

## 2015-04-01 ENCOUNTER — Other Ambulatory Visit: Payer: Self-pay

## 2015-04-01 DIAGNOSIS — Z1231 Encounter for screening mammogram for malignant neoplasm of breast: Secondary | ICD-10-CM

## 2015-04-05 ENCOUNTER — Emergency Department (HOSPITAL_COMMUNITY): Payer: 59

## 2015-04-05 ENCOUNTER — Encounter (HOSPITAL_COMMUNITY): Payer: Self-pay | Admitting: Emergency Medicine

## 2015-04-05 ENCOUNTER — Emergency Department (HOSPITAL_COMMUNITY)
Admission: EM | Admit: 2015-04-05 | Discharge: 2015-04-05 | Disposition: A | Discharge status: Home / Self Care | Payer: 59 | Attending: Emergency Medicine | Admitting: Emergency Medicine

## 2015-04-05 DIAGNOSIS — M5410 Radiculopathy, site unspecified: Secondary | ICD-10-CM | POA: Insufficient documentation

## 2015-04-05 DIAGNOSIS — J45909 Unspecified asthma, uncomplicated: Secondary | ICD-10-CM | POA: Insufficient documentation

## 2015-04-05 DIAGNOSIS — M545 Low back pain: Secondary | ICD-10-CM | POA: Diagnosis present

## 2015-04-05 DIAGNOSIS — Z79899 Other long term (current) drug therapy: Secondary | ICD-10-CM | POA: Diagnosis not present

## 2015-04-05 DIAGNOSIS — E669 Obesity, unspecified: Secondary | ICD-10-CM | POA: Insufficient documentation

## 2015-04-05 LAB — URINE MICROSCOPIC-ADD ON
Bacteria, UA: NONE SEEN
RBC / HPF: NONE SEEN RBC/hpf (ref 0–5)

## 2015-04-05 LAB — URINALYSIS, ROUTINE W REFLEX MICROSCOPIC
BILIRUBIN URINE: NEGATIVE
Glucose, UA: NEGATIVE mg/dL
HGB URINE DIPSTICK: NEGATIVE
Ketones, ur: 15 mg/dL — AB
Nitrite: NEGATIVE
Protein, ur: NEGATIVE mg/dL
Specific Gravity, Urine: 1.021 (ref 1.005–1.030)
pH: 6.5 (ref 5.0–8.0)

## 2015-04-05 MED ORDER — IBUPROFEN 400 MG PO TABS
800.0000 mg | ORAL_TABLET | Freq: Once | ORAL | Status: AC
  Administered 2015-04-05: 800 mg via ORAL
  Filled 2015-04-05: qty 2

## 2015-04-05 MED ORDER — IBUPROFEN 800 MG PO TABS: 800.0000 mg | ORAL_TABLET | Freq: Three times a day (TID) | ORAL | Status: DC

## 2015-04-05 MED ORDER — ACETAMINOPHEN 325 MG PO TABS
650.0000 mg | ORAL_TABLET | Freq: Once | ORAL | Status: AC
  Administered 2015-04-05: 650 mg via ORAL
  Filled 2015-04-05: qty 2

## 2015-04-05 MED ORDER — HYDROCODONE-ACETAMINOPHEN 5-325 MG PO TABS: 1.0000 | ORAL_TABLET | ORAL | Status: DC | PRN

## 2015-04-05 NOTE — ED Provider Notes (Signed)
CSN: [id number]     Arrival date & time 04/05/15  1108 History  By signing my name below, I, Robin Fleming, attest that this documentation has been prepared under the direction and in the presence of Gloriann Loan, PA-C Electronically Signed: Ladene Artist, ED Scribe 04/05/2015 at 12:35 PM.   Chief Complaint  Patient presents with  . Back Pain   HPI HPI Comments: Robin Fleming is a 45 y.o. female who presents to the Emergency Department complaining of gradually worsening, constant, moderate low back pain onset 1 week ago. Pt denies fall or injury. Pt describes pain as a constant sharp, burning sensation that radiates across her hips. Pain is worsened with coughing, palpation, movement and ambulating. She denies fever, abdominal pain, dysuria, hematuria, any urinary symptoms, numbness/tingling in feet, new bladder/bowel incontinence. No h/o CA or IV drug use. She recently started seeing a chiropractor who wants to order a MRI.   Past Medical History  Diagnosis Date  . Obesity   . Asthma    Past Surgical History  Procedure Laterality Date  . Tubal ligation    . Mouth surgery     Family History  Problem Relation Age of Onset  . Hypertension Mother   . Kidney disease Father    Social History  Substance Use Topics  . Smoking status: Never Smoker   . Smokeless tobacco: Never Used  . Alcohol Use: No   OB History    Gravida Para Term Preterm AB TAB SAB Ectopic Multiple Living   2 2 2       2      Review of Systems  Constitutional: Negative for fever.  Gastrointestinal: Negative for abdominal pain.  Genitourinary: Negative for dysuria and hematuria.  Musculoskeletal: Positive for back pain.  All other systems reviewed and are negative.  Allergies  Gadolinium  Home Medications   Prior to Admission medications   Medication Sig Start Date End Date Taking? Authorizing Provider  albuterol (PROVENTIL HFA;VENTOLIN HFA) 108 (90 BASE) MCG/ACT inhaler Inhale 2 puffs into the lungs every  6 (six) hours as needed for wheezing or shortness of breath.    Historical Provider, MD  albuterol (PROVENTIL) (2.5 MG/3ML) 0.083% nebulizer solution Take 2.5 mg by nebulization every 6 (six) hours as needed for wheezing.    Historical Provider, MD  Cholecalciferol (VITAMIN D-3 PO) Take 2 tablets by mouth daily.    Historical Provider, MD  CRANBERRY PO Take 1 tablet by mouth daily.    Historical Provider, MD  Cyanocobalamin (VITAMIN B-12 PO) Take 2 tablets by mouth daily.    Historical Provider, MD  hydrochlorothiazide (HYDRODIURIL) 12.5 MG tablet Take 12.5 mg by mouth daily. 06/19/14   Historical Provider, MD  meclizine (ANTIVERT) 25 MG tablet Take 1 tablet (25 mg total) by mouth 2 (two) times daily as needed. 01/28/15   Onley Lions, PA-C  montelukast (SINGULAIR) 10 MG tablet Take 10 mg by mouth daily as needed (for allergies).  11/12/12   Historical Provider, MD  Multiple Vitamins-Minerals (MULTIVITAMIN PO) Take 1 tablet by mouth daily.    Historical Provider, MD  QVAR 80 MCG/ACT inhaler Inhale 2 puffs into the lungs 2 (two) times daily. 01/18/15   Historical Provider, MD   BP 131/95 mmHg  Pulse 100  Temp(Src) 98.5 F (36.9 C) (Oral)  Resp 18  SpO2 100%  LMP  (LMP Unknown) Physical Exam  Constitutional: She is oriented to person, place, and time. She appears well-developed and well-nourished.  HENT:  Head: Atraumatic.  Eyes: Conjunctivae are normal.  Cardiovascular: Normal rate, regular rhythm, normal heart sounds and intact distal pulses.   Pulses:      Dorsalis pedis pulses are 2+ on the right side, and 2+ on the left side.  Pulmonary/Chest: Effort normal and breath sounds normal.  Abdominal: Soft. Bowel sounds are normal. She exhibits no distension. There is no tenderness.  Musculoskeletal: Normal range of motion. She exhibits tenderness.  Lumbar spine TTP along with bilateral lumbar musculature.  No step offs. No crepitus.  Neurological: She is alert and oriented to person,  place, and time.  No saddle anesthesia. Strength and sensation intact bilaterally throughout lower extremities.  Gait slow, but normal.   Skin: Skin is warm and dry.  Psychiatric: She has a normal mood and affect. Her behavior is normal.   ED Course  Procedures (including critical care time) DIAGNOSTIC STUDIES: Oxygen Saturation is 100% on RA, normal by my interpretation.    COORDINATION OF CARE: 11:54 AM-Discussed treatment plan which includes UA, XR, Tylenol and ibuprofen with pt at bedside and pt agreed to plan.   Labs Review Labs Reviewed  URINALYSIS, ROUTINE W REFLEX MICROSCOPIC (NOT AT [facility])   Imaging Review Dg Lumbar Spine Complete  04/05/2015  CLINICAL DATA:  Lower lumbar spine pain for past 2 weeks worse in past 2-3 days with BILATERAL leg pain EXAM: LUMBAR SPINE - COMPLETE 4+ VIEW COMPARISON:  None FINDINGS: Five non-rib-bearing lumbar vertebra. Slight disc space narrowing L4-L5. Vertebral body and disc space heights otherwise maintained. No acute fracture, subluxation or bone destruction. No spondylolysis. SI joints symmetric. IMPRESSION: Minimal degenerative disc disease changes L4-L5. No acute abnormalities. Electronically Signed   By: Lavonia Dana M.D.   On: 04/05/2015 12:31   I have personally reviewed and evaluated these images and lab results as part of my medical decision-making.   EKG Interpretation None      MDM   Final diagnoses:  Bilateral low back pain, with sciatica presence unspecified  Radiculopathy, unspecified spinal region    Patient presents with low back pain.  VSS.  No injury/trauma.  No red flags.  No neurological deficits.  Distal pulses intact.  No gait abnormalities.  Plain films without acute abnormalities.  Minimal degenerative disc disease changes at L4-L5. Suspect low back strain or other mechanical cause.  Doubt cauda equina.  Doubt infectious process.  Doubt AAA.  Discussed return precautions to the ED.  Follow up with PCP.   I personally  performed the services described in this documentation, which was scribed in my presence. The recorded information has been reviewed and is accurate.    Gloriann Loan, PA-C 04/05/15 1256  Gloriann Loan, PA-C 04/05/15 1258  Sherwood Gambler, MD 04/06/15 1100

## 2015-04-05 NOTE — Discharge Instructions (Signed)

## 2015-04-05 NOTE — ED Notes (Signed)
Pain started a week ago. Pt states she had this pain in 2015 and had treatment from a chiropractor.

## 2015-04-05 NOTE — ED Notes (Signed)
Pt states "i think i injured my back". Pt states the pain is in the center of her lower back.  Pt states the pain travels down both her legs. Pt denies any urinary symptoms.

## 2015-04-18 ENCOUNTER — Emergency Department (HOSPITAL_COMMUNITY)
Admission: EM | Admit: 2015-04-18 | Discharge: 2015-04-19 | Disposition: A | Discharge status: Home / Self Care | Payer: 59 | Attending: Emergency Medicine | Admitting: Emergency Medicine

## 2015-04-18 ENCOUNTER — Encounter (HOSPITAL_COMMUNITY): Payer: Self-pay | Admitting: *Deleted

## 2015-04-18 DIAGNOSIS — S3992XA Unspecified injury of lower back, initial encounter: Secondary | ICD-10-CM | POA: Diagnosis not present

## 2015-04-18 DIAGNOSIS — R110 Nausea: Secondary | ICD-10-CM | POA: Insufficient documentation

## 2015-04-18 DIAGNOSIS — R42 Dizziness and giddiness: Secondary | ICD-10-CM | POA: Diagnosis not present

## 2015-04-18 DIAGNOSIS — J45909 Unspecified asthma, uncomplicated: Secondary | ICD-10-CM | POA: Diagnosis not present

## 2015-04-18 DIAGNOSIS — E669 Obesity, unspecified: Secondary | ICD-10-CM | POA: Insufficient documentation

## 2015-04-18 DIAGNOSIS — Y92009 Unspecified place in unspecified non-institutional (private) residence as the place of occurrence of the external cause: Secondary | ICD-10-CM | POA: Diagnosis not present

## 2015-04-18 DIAGNOSIS — K5900 Constipation, unspecified: Secondary | ICD-10-CM | POA: Insufficient documentation

## 2015-04-18 DIAGNOSIS — Z3202 Encounter for pregnancy test, result negative: Secondary | ICD-10-CM | POA: Insufficient documentation

## 2015-04-18 DIAGNOSIS — S0990XA Unspecified injury of head, initial encounter: Secondary | ICD-10-CM | POA: Insufficient documentation

## 2015-04-18 DIAGNOSIS — Y998 Other external cause status: Secondary | ICD-10-CM | POA: Diagnosis not present

## 2015-04-18 DIAGNOSIS — W1839XA Other fall on same level, initial encounter: Secondary | ICD-10-CM | POA: Insufficient documentation

## 2015-04-18 DIAGNOSIS — Y9301 Activity, walking, marching and hiking: Secondary | ICD-10-CM | POA: Diagnosis not present

## 2015-04-18 DIAGNOSIS — Z79899 Other long term (current) drug therapy: Secondary | ICD-10-CM | POA: Insufficient documentation

## 2015-04-18 LAB — CBC
HEMATOCRIT: 41.5 % (ref 36.0–46.0)
HEMOGLOBIN: 13.8 g/dL (ref 12.0–15.0)
MCH: 31.9 pg (ref 26.0–34.0)
MCHC: 33.3 g/dL (ref 30.0–36.0)
MCV: 95.8 fL (ref 78.0–100.0)
Platelets: 284 10*3/uL (ref 150–400)
RBC: 4.33 MIL/uL (ref 3.87–5.11)
RDW: 13.3 % (ref 11.5–15.5)
WBC: 9.2 10*3/uL (ref 4.0–10.5)

## 2015-04-18 LAB — BASIC METABOLIC PANEL
ANION GAP: 10 (ref 5–15)
BUN: 8 mg/dL (ref 6–20)
CHLORIDE: 102 mmol/L (ref 101–111)
CO2: 26 mmol/L (ref 22–32)
CREATININE: 0.7 mg/dL (ref 0.44–1.00)
Calcium: 9.3 mg/dL (ref 8.9–10.3)
GFR calc non Af Amer: >60 mL/min (ref >60)
Glucose, Bld: 109 mg/dL — ABNORMAL HIGH (ref 65–99)
POTASSIUM: 4 mmol/L (ref 3.5–5.1)
Sodium: 138 mmol/L (ref 135–145)

## 2015-04-18 LAB — CBG MONITORING, ED: Glucose-Capillary: 107 mg/dL — ABNORMAL HIGH (ref 65–99)

## 2015-04-18 MED ORDER — DIAZEPAM 5 MG PO TABS
5.0000 mg | ORAL_TABLET | Freq: Once | ORAL | Status: AC
  Administered 2015-04-19: 5 mg via ORAL
  Filled 2015-04-18: qty 1

## 2015-04-18 NOTE — ED Notes (Signed)
Pt c/o dizziness onset x 5 days, pt reports falling today hitting head her head on floor, denies LOC, pt denies slurred speech, pt reports seeing a chiropractor for L4 & L5 bulging disk for the past 5 wks, pt c/o nausea, pt denies v/d, CP & SOB, denies visual changes

## 2015-04-18 NOTE — ED Notes (Signed)
PA at bedside.

## 2015-04-18 NOTE — ED Provider Notes (Signed)
CSN: [id number]     Arrival date & time 04/18/15  1710 History   First MD Initiated Contact with Patient 04/18/15 2245     Chief Complaint  Patient presents with  . Fall  . Dizziness     (Consider location/radiation/quality/duration/timing/severity/associated sxs/prior Treatment) HPI Comments: Robin Fleming is a 45 y.o. female who presents today with a 6 day history of episodes dizziness. The patient describes the symptoms as vertigo, room- spinning. This is the third visit to the ED for vertigo symptoms this year. Normally, episodes last a few minutes. Today's episode lasted around 6 hours. Patient tried to walk at home and felt like she was going to fall so she lowered herself to the ground. She did not hit her head or injure herself. Patient has associated nausea, no vomiting, since today. Symptoms are exacerbated by rising from supine position. Patient has been able to ambulate all week until the episode today that last longer and was worse than normal.  Patient denies aural pressure, otalgia, tinnitus, hearing loss.  She has been treated with meclizine (Antivert) with poor improvement.  Patient currently being treated for back pain by a chiropractor. Recently had MRI showing disc bulge at L4-L5 Patient denies fever, chills, chest pain, shortness of breath, abdominal pain, dysuria, vision changes. Patient endorses headache, constipation, and cold sweat at time of falling feeling.    Patient is a 45 y.o. female presenting with fall and dizziness. The history is provided by the patient.  Fall Associated symptoms include coughing, headaches and nausea. Pertinent negatives include no abdominal pain, chest pain, chills, fever, rash or vomiting.  Dizziness Associated symptoms: headaches and nausea   Associated symptoms: no chest pain, no shortness of breath and no vomiting     Past Medical History  Diagnosis Date  . Obesity   . Asthma    Past Surgical History  Procedure Laterality Date  .  Tubal ligation    . Mouth surgery     Family History  Problem Relation Age of Onset  . Hypertension Mother   . Kidney disease Father    Social History  Substance Use Topics  . Smoking status: Never Smoker   . Smokeless tobacco: Never Used  . Alcohol Use: No   OB History    Gravida Para Term Preterm AB TAB SAB Ectopic Multiple Living   2 2 2       2      Review of Systems  Constitutional: Negative for fever, chills and appetite change.  HENT: Negative for facial swelling.   Eyes: Negative for visual disturbance.  Respiratory: Positive for cough. Negative for shortness of breath.   Cardiovascular: Negative for chest pain.  Gastrointestinal: Positive for nausea and constipation. Negative for vomiting and abdominal pain.  Genitourinary: Negative for dysuria.  Musculoskeletal: Positive for back pain.  Skin: Negative for rash and wound.  Neurological: Positive for dizziness and headaches.  Psychiatric/Behavioral: The patient is not nervous/anxious.       Allergies  Gadolinium  Home Medications   Prior to Admission medications   Medication Sig Start Date End Date Taking? Authorizing Provider  albuterol (PROVENTIL HFA;VENTOLIN HFA) 108 (90 BASE) MCG/ACT inhaler Inhale 2 puffs into the lungs every 6 (six) hours as needed for wheezing or shortness of breath.   Yes Historical Provider, MD  albuterol (PROVENTIL) (2.5 MG/3ML) 0.083% nebulizer solution Take 2.5 mg by nebulization every 6 (six) hours as needed for wheezing.   Yes Historical Provider, MD  Cholecalciferol (VITAMIN D-3 PO)  Take 2 tablets by mouth daily.   Yes Historical Provider, MD  CRANBERRY PO Take 1 tablet by mouth daily.   Yes Historical Provider, MD  Cyanocobalamin (VITAMIN B-12 PO) Take 2 tablets by mouth daily. Reported on 04/18/2015   Yes Historical Provider, MD  hydrochlorothiazide (HYDRODIURIL) 12.5 MG tablet Take 12.5 mg by mouth daily. 06/19/14  Yes Historical Provider, MD  HYDROcodone-acetaminophen  (NORCO/VICODIN) 5-325 MG tablet Take 1 tablet by mouth every 4 (four) hours as needed. Patient taking differently: Take 1 tablet by mouth every 8 (eight) hours as needed for moderate pain.  04/05/15  Yes Kayla Rose, PA-C  ibuprofen (ADVIL,MOTRIN) 800 MG tablet Take 1 tablet (800 mg total) by mouth 3 (three) times daily. Patient taking differently: Take 800 mg by mouth every 8 (eight) hours as needed for moderate pain.  04/05/15  Yes Gloriann Loan, PA-C  meclizine (ANTIVERT) 25 MG tablet Take 1 tablet (25 mg total) by mouth 2 (two) times daily as needed. Patient taking differently: Take 25 mg by mouth 2 (two) times daily as needed for dizziness.  01/28/15  Yes [place] Lions, PA-C  montelukast (SINGULAIR) 10 MG tablet Take 10 mg by mouth at bedtime.  11/12/12  Yes Historical Provider, MD  Multiple Vitamins-Minerals (MULTIVITAMIN PO) Take 1 tablet by mouth daily.   Yes Historical Provider, MD  QVAR 80 MCG/ACT inhaler Inhale 2 puffs into the lungs every morning.  01/18/15  Yes Historical Provider, MD  diazepam (VALIUM) 5 MG tablet Take 1 tablet (5 mg total) by mouth 2 (two) times daily as needed (for dizziness). 04/19/15   Brave Dack M Olivianna Higley, PA-C   BP 135/99 mmHg  Pulse 64  Temp(Src) 97.8 F (36.6 C) (Oral)  Resp 18  SpO2 100%  LMP  (LMP Unknown) Physical Exam  Constitutional: She appears well-developed and well-nourished. No distress.  HENT:  Head: Normocephalic and atraumatic.  Mouth/Throat: Oropharynx is clear and moist.  Eyes: Conjunctivae are normal. Pupils are equal, round, and reactive to light. Right eye exhibits no discharge. Left eye exhibits no discharge. No scleral icterus.  Neck: Normal range of motion. Neck supple. No thyromegaly present.  Cardiovascular: Normal rate, regular rhythm and normal heart sounds.  Exam reveals no gallop and no friction rub.   No murmur heard. Pulmonary/Chest: Effort normal and breath sounds normal. No stridor. No respiratory distress. She has no wheezes.  She has no rales.  Abdominal: Soft. Bowel sounds are normal. She exhibits no distension. There is no tenderness. There is no rebound and no guarding.  Musculoskeletal: She exhibits no edema.  Lymphadenopathy:    She has no cervical adenopathy.  Neurological: She is alert. She has normal strength. She is not disoriented. No cranial nerve deficit or sensory deficit. Coordination normal.  Reflex Scores:      Patellar reflexes are 2+ on the right side and 2+ on the left side. Patient describes paraesthesia from left pinky to above left wrist  Skin: Skin is warm and dry. No rash noted. She is not diaphoretic. No pallor.  Psychiatric: She has a normal mood and affect.  Nursing note and vitals reviewed.   ED Course  Procedures (including critical care time) Labs Review Labs Reviewed  BASIC METABOLIC PANEL - Abnormal; Notable for the following:    Glucose, Bld 109 (*)    All other components within normal limits  CBG MONITORING, ED - Abnormal; Notable for the following:    Glucose-Capillary 107 (*)    All other components within normal limits  CBC  URINALYSIS, ROUTINE W REFLEX MICROSCOPIC (NOT AT [facility])  POC URINE PREG, ED    Imaging Review No results found. I have personally reviewed and evaluated these images and lab results as part of my medical decision-making.   EKG Interpretation   Date/Time:  Thursday April 18 2015 18:00:17 EST Ventricular Rate:  72 PR Interval:  162 QRS Duration: 90 QT Interval:  422 QTC Calculation: 462 R Axis:   6 Text Interpretation:  Normal sinus rhythm Normal ECG Confirmed by Christy Gentles   MD, Elenore Rota ([id number]) on 04/18/2015 11:41:27 PM      MDM   Robin Fleming is a 45 y.o. female who presents today with a 6 day history of episodes dizziness. Patient had associated nausea today, without vomiting. No focal neuro deficits. Labs unremarkable. Patient able to ambulate today and most of the week until her episode today. Patient given Valium and able to  ambulate afterward. Discharge patient home with follow up with PCP and/or ENT. Discussed patient with Alecia Lemming, PA-C who agrees with plan. Return precautions were discussed with the patient and included in discharge instructions.   Final diagnoses:  Dizziness      Frederica Kuster, PA-C 04/19/15 [place], MD 04/19/15 0100

## 2015-04-19 LAB — URINALYSIS, ROUTINE W REFLEX MICROSCOPIC
BILIRUBIN URINE: NEGATIVE
GLUCOSE, UA: NEGATIVE mg/dL
HGB URINE DIPSTICK: NEGATIVE
KETONES UR: NEGATIVE mg/dL
Leukocytes, UA: NEGATIVE
Nitrite: NEGATIVE
PH: 6.5 (ref 5.0–8.0)
PROTEIN: NEGATIVE mg/dL
Specific Gravity, Urine: 1.008 (ref 1.005–1.030)

## 2015-04-19 LAB — POC URINE PREG, ED: Preg Test, Ur: NEGATIVE

## 2015-04-19 MED ORDER — DIAZEPAM 5 MG PO TABS: 5.0000 mg | ORAL_TABLET | Freq: Two times a day (BID) | ORAL | Status: DC | PRN

## 2015-04-19 NOTE — Discharge Instructions (Signed)
Medications: Valium   Treatment: Take Valium 2 times per day as needed for dizziness. Do not drive if you are feeling dizzy.   Follow-up: Follow up with PCP tomorrow, or as soon as possible, for further evaluation of dizziness. You can follow up with an ENT doctor for further evaluation as well, whichever you can secure an appointment with first. Please return to the emergency department if you experience any numbness, weakness, dizziness that will not resolve, slurring of speech, or any other concerning symptoms.  Dizziness Dizziness is a common problem. It is a feeling of unsteadiness or light-headedness. You may feel like you are about to faint. Dizziness can lead to injury if you stumble or fall. Anyone can become dizzy, but dizziness is more common in older adults. This condition can be caused by a number of things, including medicines, dehydration, or illness. HOME CARE INSTRUCTIONS Taking these steps may help with your condition: Eating and Drinking  Drink enough fluid to keep your urine clear or pale yellow. This helps to keep you from becoming dehydrated. Try to drink more clear fluids, such as water.  Do not drink alcohol.  Limit your caffeine intake if directed by your health care provider.  Limit your salt intake if directed by your health care provider. Activity  Avoid making quick movements.  Rise slowly from chairs and steady yourself until you feel okay.  In the morning, first sit up on the side of the bed. When you feel okay, stand slowly while you hold onto something until you know that your balance is fine.  Move your legs often if you need to stand in one place for a long time. Tighten and relax your muscles in your legs while you are standing.  Do not drive or operate heavy machinery if you feel dizzy.  Avoid bending down if you feel dizzy. Place items in your home so that they are easy for you to reach without leaning over. Lifestyle  Do not use any tobacco  products, including cigarettes, chewing tobacco, or electronic cigarettes. If you need help quitting, ask your health care provider.  Try to reduce your stress level, such as with yoga or meditation. Talk with your health care provider if you need help. General Instructions  Watch your dizziness for any changes.  Take medicines only as directed by your health care provider. Talk with your health care provider if you think that your dizziness is caused by a medicine that you are taking.  Tell a friend or a family member that you are feeling dizzy. If he or she notices any changes in your behavior, have this person call your health care provider.  Keep all follow-up visits as directed by your health care provider. This is important. SEEK MEDICAL CARE IF:  Your dizziness does not go away.  Your dizziness or light-headedness gets worse.  You feel nauseous.  You have reduced hearing.  You have new symptoms.  You are unsteady on your feet or you feel like the room is spinning. SEEK IMMEDIATE MEDICAL CARE IF:  You vomit or have diarrhea and are unable to eat or drink anything.  You have problems talking, walking, swallowing, or using your arms, hands, or legs.  You feel generally weak.  You are not thinking clearly or you have trouble forming sentences. It may take a friend or family member to notice this.  You have chest pain, abdominal pain, shortness of breath, or sweating.  Your vision changes.  You notice any  bleeding.  You have a headache.  You have neck pain or a stiff neck.  You have a fever.   This information is not intended to replace advice given to you by your health care provider. Make sure you discuss any questions you have with your health care provider.   Document Released: 07/22/2000 Document Revised: 06/12/2014 Document Reviewed: 01/22/2014 Elsevier Interactive Patient Education [employer].

## 2015-05-10 ENCOUNTER — Ambulatory Visit
Admission: RE | Admit: 2015-05-10 | Discharge: 2015-05-10 | Disposition: A | Discharge status: Home / Self Care | Payer: 59 | Source: Ambulatory Visit

## 2015-05-10 DIAGNOSIS — Z1231 Encounter for screening mammogram for malignant neoplasm of breast: Secondary | ICD-10-CM

## 2015-07-25 ENCOUNTER — Ambulatory Visit (INDEPENDENT_AMBULATORY_CARE_PROVIDER_SITE_OTHER): Payer: 59 | Admitting: Obstetrics

## 2015-07-25 ENCOUNTER — Encounter: Payer: Self-pay | Admitting: Obstetrics

## 2015-07-25 VITALS — BP 128/84 | HR 93 | Temp 98.3°F | Wt 195.0 lb

## 2015-07-25 DIAGNOSIS — Z01419 Encounter for gynecological examination (general) (routine) without abnormal findings: Secondary | ICD-10-CM

## 2015-07-25 DIAGNOSIS — N760 Acute vaginitis: Secondary | ICD-10-CM

## 2015-07-25 DIAGNOSIS — Z780 Asymptomatic menopausal state: Secondary | ICD-10-CM

## 2015-07-25 DIAGNOSIS — N393 Stress incontinence (female) (male): Secondary | ICD-10-CM

## 2015-07-25 DIAGNOSIS — B9689 Other specified bacterial agents as the cause of diseases classified elsewhere: Secondary | ICD-10-CM

## 2015-07-25 NOTE — Progress Notes (Signed)
Subjective:        Robin Fleming is a 45 y.o. female here for a routine exam.  Current complaints: Leaking of urine.    Personal health questionnaire:  Is patient [name], have a family history of breast and/or ovarian cancer: no Is there a family history of uterine cancer diagnosed at age < 69, gastrointestinal cancer, urinary tract cancer, family member who is a Field seismologist syndrome-associated carrier: no Is the patient overweight and hypertensive, family history of diabetes, personal history of gestational diabetes, preeclampsia or PCOS: no Is patient over 89, have PCOS,  family history of premature CHD under age 2, diabetes, smoke, have hypertension or peripheral artery disease:  no At any time, has a partner hit, kicked or otherwise hurt or frightened you?: no Over the past 2 weeks, have you felt down, depressed or hopeless?: no Over the past 2 weeks, have you felt little interest or pleasure in doing things?:no   Gynecologic History No LMP recorded (lmp unknown). Patient is postmenopausal. Contraception: tubal ligation Last Pap: 2016. Results were: normal Last mammogram: 2017. Results were: normal  Obstetric History OB History  Gravida Para Term Preterm AB SAB TAB Ectopic Multiple Living  2 2 2       2     # Outcome Date GA Lbr Len/2nd Weight Sex Delivery Anes PTL Lv  2 Term         Y  1 Term         Y      Past Medical History  Diagnosis Date  . Obesity   . Asthma     Past Surgical History  Procedure Laterality Date  . Tubal ligation    . Mouth surgery       Current outpatient prescriptions:  .  albuterol (PROVENTIL HFA;VENTOLIN HFA) 108 (90 BASE) MCG/ACT inhaler, Inhale 2 puffs into the lungs every 6 (six) hours as needed for wheezing or shortness of breath., Disp: , Rfl:  .  albuterol (PROVENTIL) (2.5 MG/3ML) 0.083% nebulizer solution, Take 2.5 mg by nebulization every 6 (six) hours as needed for wheezing., Disp: , Rfl:  .  Cholecalciferol (VITAMIN D-3  PO), Take 2 tablets by mouth daily., Disp: , Rfl:  .  CRANBERRY PO, Take 1 tablet by mouth daily., Disp: , Rfl:  .  Cyanocobalamin (VITAMIN B-12 PO), Take 2 tablets by mouth daily. Reported on 04/18/2015, Disp: , Rfl:  .  diazepam (VALIUM) 5 MG tablet, Take 1 tablet (5 mg total) by mouth 2 (two) times daily as needed (for dizziness)., Disp: 10 tablet, Rfl: 0 .  hydrochlorothiazide (HYDRODIURIL) 12.5 MG tablet, Take 12.5 mg by mouth daily., Disp: , Rfl: 2 .  meclizine (ANTIVERT) 25 MG tablet, Take 1 tablet (25 mg total) by mouth 2 (two) times daily as needed. (Patient taking differently: Take 25 mg by mouth 2 (two) times daily as needed for dizziness. ), Disp: 30 tablet, Rfl: 0 .  montelukast (SINGULAIR) 10 MG tablet, Take 10 mg by mouth at bedtime. , Disp: , Rfl:  .  Multiple Vitamins-Minerals (MULTIVITAMIN PO), Take 1 tablet by mouth daily., Disp: , Rfl:  .  QVAR 80 MCG/ACT inhaler, Inhale 2 puffs into the lungs every morning. , Disp: , Rfl: 1 Allergies  Allergen Reactions  . Gadolinium      Desc: Pt developed nausea and vomiting directly after hand injecting 17cc Multihance for MRI Brain.     Social History  Substance Use Topics  . Smoking status: Never Smoker   .  Smokeless tobacco: Never Used  . Alcohol Use: No    Family History  Problem Relation Age of Onset  . Hypertension Mother   . Kidney disease Father       Review of Systems  Constitutional: negative for fatigue and weight loss Respiratory: negative for cough and wheezing Cardiovascular: negative for chest pain, fatigue and palpitations Gastrointestinal: negative for abdominal pain and change in bowel habits Musculoskeletal:negative for myalgias Neurological: negative for gait problems and tremors Behavioral/Psych: negative for abusive relationship, depression Endocrine: negative for temperature intolerance   Genitourinary:negative for abnormal menstrual periods, genital lesions, hot flashes, sexual problems and vaginal  discharge.  Positive for leaking of urine. Integument/breast: negative for breast lump, breast tenderness, nipple discharge and skin lesion(s)    Objective:       BP 128/84 mmHg  Pulse 93  Temp(Src) 98.3 F (36.8 C)  Wt 195 lb (88.451 kg)  LMP  (LMP Unknown) General:   alert  Skin:   no rash or abnormalities  Lungs:   clear to auscultation bilaterally  Heart:   regular rate and rhythm, S1, S2 normal, no murmur, click, rub or gallop  Breasts:   normal without suspicious masses, skin or nipple changes or axillary nodes  Abdomen:  normal findings: no organomegaly, soft, non-tender and no hernia  Pelvis:  External genitalia: normal general appearance Urinary system: urethral meatus normal and bladder without fullness, nontender Vaginal: normal without tenderness, induration or masses Cervix: normal appearance Adnexa: normal bimanual exam Uterus: anteverted and non-tender, normal size   Lab Review Urine pregnancy test Labs reviewed yes Radiologic studies reviewed yes    Assessment:    Healthy female exam.    SUI   Plan:    Referred to Urogynecology  Education reviewed: calcium supplements, low fat, low cholesterol diet, safe sex/STD prevention, self breast exams and weight bearing exercise. Contraception: tubal ligation. Follow up in: 1 year.   No orders of the defined types were placed in this encounter.   Orders Placed This Encounter  Procedures  . Urine Culture  . Ambulatory referral to Urogynecology    Referral Priority:  Routine    Referral Type:  Consultation    Referral Reason:  Specialty Services Required    Requested Specialty:  Urology    Number of Visits Requested:  1

## 2015-07-26 ENCOUNTER — Encounter: Payer: Self-pay | Admitting: Obstetrics

## 2015-07-30 ENCOUNTER — Other Ambulatory Visit: Payer: Self-pay | Admitting: Obstetrics

## 2015-07-30 DIAGNOSIS — B9689 Other specified bacterial agents as the cause of diseases classified elsewhere: Secondary | ICD-10-CM

## 2015-07-30 DIAGNOSIS — N760 Acute vaginitis: Principal | ICD-10-CM

## 2015-07-30 LAB — PAP IG AND HPV HIGH-RISK
HPV, high-risk: NEGATIVE
PAP Smear Comment: 0

## 2015-07-30 LAB — NUSWAB VG+, CANDIDA 6SP
ATOPOBIUM VAGINAE: HIGH {score} — AB
BVAB 2: HIGH {score} — AB
CANDIDA ALBICANS, NAA: NEGATIVE
CANDIDA GLABRATA, NAA: NEGATIVE
CANDIDA KRUSEI, NAA: NEGATIVE
CHLAMYDIA TRACHOMATIS, NAA: NEGATIVE
Candida lusitaniae, NAA: NEGATIVE
Candida parapsilosis, NAA: NEGATIVE
Candida tropicalis, NAA: NEGATIVE
Megasphaera 1: HIGH Score — AB
Neisseria gonorrhoeae, NAA: NEGATIVE
TRICH VAG BY NAA: NEGATIVE

## 2015-07-30 MED ORDER — METRONIDAZOLE 500 MG PO TABS: 500.0000 mg | ORAL_TABLET | Freq: Two times a day (BID) | ORAL | Status: DC

## 2015-08-01 ENCOUNTER — Encounter: Payer: Self-pay | Admitting: *Deleted

## 2016-04-08 ENCOUNTER — Encounter: Payer: Self-pay | Admitting: Obstetrics

## 2016-04-08 ENCOUNTER — Ambulatory Visit (INDEPENDENT_AMBULATORY_CARE_PROVIDER_SITE_OTHER): Payer: 59 | Admitting: Obstetrics

## 2016-04-08 VITALS — BP 142/94 | HR 76 | Ht 62.0 in | Wt 204.8 lb

## 2016-04-08 DIAGNOSIS — N393 Stress incontinence (female) (male): Secondary | ICD-10-CM

## 2016-04-08 DIAGNOSIS — L98419 Non-pressure chronic ulcer of buttock with unspecified severity: Secondary | ICD-10-CM

## 2016-04-08 MED ORDER — AZITHROMYCIN 250 MG PO TABS: 1000.0000 mg | ORAL_TABLET | Freq: Once | ORAL | 0 refills | Status: AC

## 2016-04-08 NOTE — Progress Notes (Addendum)
Patient ID: Robin Fleming, female   DOB: 23-Jan-1971, 46 y.o.   MRN: [id number]  No chief complaint on file.   HPI Robin Fleming is a 46 y.o. female.  Tender open sore on inner buttock that arose this past weekend.  She says that her husband has a sore that comes and goes on his penis. HPI  Past Medical History:  Diagnosis Date  . Asthma   . Obesity     Past Surgical History:  Procedure Laterality Date  . MOUTH SURGERY    . TUBAL LIGATION      Family History  Problem Relation Age of Onset  . Hypertension Mother   . Kidney disease Father     Social History Social History  Substance Use Topics  . Smoking status: Never Smoker  . Smokeless tobacco: Never Used  . Alcohol use No    Allergies  Allergen Reactions  . Gadolinium      Desc: Pt developed nausea and vomiting directly after hand injecting 17cc Multihance for MRI Brain.     Current Outpatient Prescriptions  Medication Sig Dispense Refill  . albuterol (PROVENTIL HFA;VENTOLIN HFA) 108 (90 BASE) MCG/ACT inhaler Inhale 2 puffs into the lungs every 6 (six) hours as needed for wheezing or shortness of breath.    Marland Kitchen albuterol (PROVENTIL) (2.5 MG/3ML) 0.083% nebulizer solution Take 2.5 mg by nebulization every 6 (six) hours as needed for wheezing.    . hydrochlorothiazide (HYDRODIURIL) 12.5 MG tablet Take 12.5 mg by mouth daily.  2  . montelukast (SINGULAIR) 10 MG tablet Take 10 mg by mouth at bedtime.     Marland Kitchen QVAR 80 MCG/ACT inhaler Inhale 2 puffs into the lungs every morning.   1  . Cholecalciferol (VITAMIN D-3 PO) Take 2 tablets by mouth daily.    Marland Kitchen CRANBERRY PO Take 1 tablet by mouth daily.    . Cyanocobalamin (VITAMIN B-12 PO) Take 2 tablets by mouth daily. Reported on 04/18/2015    . diazepam (VALIUM) 5 MG tablet Take 1 tablet (5 mg total) by mouth 2 (two) times daily as needed (for dizziness). (Patient not taking: Reported on 04/08/2016) 10 tablet 0  . meclizine (ANTIVERT) 25 MG tablet Take 1 tablet (25 mg total) by  mouth 2 (two) times daily as needed. (Patient not taking: Reported on 04/08/2016) 30 tablet 0  . metroNIDAZOLE (FLAGYL) 500 MG tablet Take 1 tablet (500 mg total) by mouth 2 (two) times daily. (Patient not taking: Reported on 04/08/2016) 14 tablet 2  . Multiple Vitamins-Minerals (MULTIVITAMIN PO) Take 1 tablet by mouth daily.    . valACYclovir (VALTREX) 1000 MG tablet 1 tab po bid for 3 days, then prn each outbreak. 30 tablet prn   No current facility-administered medications for this visit.     Review of Systems Review of Systems Constitutional: negative for fatigue and weight loss Respiratory: negative for cough and wheezing Cardiovascular: negative for chest pain, fatigue and palpitations Gastrointestinal: negative for abdominal pain and change in bowel habits Genitourinary:positive for tender sore on inner buttocks Integument/breast: negative for nipple discharge Musculoskeletal:negative for myalgias Neurological: negative for gait problems and tremors Behavioral/Psych: negative for abusive relationship, depression Endocrine: negative for temperature intolerance      Blood pressure (!) 142/94, pulse 76, height 5\' 2"  (1.575 m), weight 204 lb 12.8 oz (92.9 kg).  Physical Exam Physical Exam General:   alert  Skin:   ulceration of inner buttocks area.  Tender.  Clean.  Cultures done Pelvic:  NEFG Vagina:  Normal mucosa.  Bladder well supported. Uterus:  NSSC, NT Adnexa: Negative               50% of 15 min visit spent on counseling and coordination of care.    Data Reviewed Labs  Assessment     Skin ulcer of buttocks SUI    Plan     Cultures done for aerobic and anaerobic bacteria.  Will consider treating empirically for Chancroid.  Referred to Urogynecology for SUI evaluation  F/U prn   Orders Placed This Encounter  Procedures  . Herpes simplex virus culture  . Anaerobic and Aerobic Culture  . Ambulatory referral to Urogynecology    Referral Priority:    Routine    Referral Type:   Consultation    Referral Reason:   Specialty Services Required    Requested Specialty:   Urology    Number of Visits Requested:   1   Meds ordered this encounter  Medications  . azithromycin (ZITHROMAX) 250 MG tablet    Sig: Take 4 tablets (1,000 mg total) by mouth once.    Dispense:  4 tablet    Refill:  0

## 2016-04-08 NOTE — Progress Notes (Signed)
Pt c/o "burning" feeling outer vulva x 4 days. Pt states it looks like a burn mark.

## 2016-04-11 ENCOUNTER — Other Ambulatory Visit: Payer: Self-pay | Admitting: Obstetrics

## 2016-04-11 DIAGNOSIS — A6000 Herpesviral infection of urogenital system, unspecified: Secondary | ICD-10-CM

## 2016-04-11 LAB — HERPES SIMPLEX VIRUS CULTURE

## 2016-04-11 MED ORDER — VALACYCLOVIR HCL 1 G PO TABS: ORAL_TABLET | ORAL | 99 refills | Status: DC

## 2016-04-13 LAB — ANAEROBIC AND AEROBIC CULTURE

## 2016-04-15 ENCOUNTER — Telehealth: Payer: Self-pay

## 2016-04-15 NOTE — Telephone Encounter (Signed)
Spoke to patient and advised of results and rx.

## 2016-05-11 ENCOUNTER — Other Ambulatory Visit: Payer: Self-pay | Admitting: Internal Medicine

## 2016-05-11 DIAGNOSIS — Z1231 Encounter for screening mammogram for malignant neoplasm of breast: Secondary | ICD-10-CM

## 2016-05-27 ENCOUNTER — Ambulatory Visit
Admission: RE | Admit: 2016-05-27 | Discharge: 2016-05-27 | Disposition: A | Discharge status: Home / Self Care | Payer: 59 | Source: Ambulatory Visit | Attending: Internal Medicine | Admitting: Internal Medicine

## 2016-05-27 DIAGNOSIS — Z1231 Encounter for screening mammogram for malignant neoplasm of breast: Secondary | ICD-10-CM

## 2019-07-25 ENCOUNTER — Encounter: Payer: Self-pay | Admitting: Obstetrics

## 2019-07-25 ENCOUNTER — Ambulatory Visit (HOSPITAL_BASED_OUTPATIENT_CLINIC_OR_DEPARTMENT_OTHER): Payer: Medicaid Other | Admitting: Obstetrics

## 2019-07-25 ENCOUNTER — Other Ambulatory Visit (HOSPITAL_COMMUNITY)
Admission: RE | Admit: 2019-07-25 | Discharge: 2019-07-25 | Disposition: A | Discharge status: Home / Self Care | Payer: Medicaid Other | Source: Ambulatory Visit | Attending: Obstetrics | Admitting: Obstetrics

## 2019-07-25 ENCOUNTER — Other Ambulatory Visit: Payer: Self-pay

## 2019-07-25 VITALS — BP 143/97 | HR 68 | Ht 62.0 in | Wt 191.0 lb

## 2019-07-25 DIAGNOSIS — Z0000 Encounter for general adult medical examination without abnormal findings: Secondary | ICD-10-CM | POA: Diagnosis not present

## 2019-07-25 DIAGNOSIS — B9689 Other specified bacterial agents as the cause of diseases classified elsewhere: Secondary | ICD-10-CM | POA: Diagnosis not present

## 2019-07-25 DIAGNOSIS — Z01419 Encounter for gynecological examination (general) (routine) without abnormal findings: Secondary | ICD-10-CM

## 2019-07-25 DIAGNOSIS — Z780 Asymptomatic menopausal state: Secondary | ICD-10-CM

## 2019-07-25 DIAGNOSIS — E669 Obesity, unspecified: Secondary | ICD-10-CM

## 2019-07-25 DIAGNOSIS — Z1239 Encounter for other screening for malignant neoplasm of breast: Secondary | ICD-10-CM

## 2019-07-25 DIAGNOSIS — E66811 Obesity, class 1: Secondary | ICD-10-CM

## 2019-07-25 DIAGNOSIS — N898 Other specified noninflammatory disorders of vagina: Secondary | ICD-10-CM | POA: Diagnosis present

## 2019-07-25 DIAGNOSIS — Z1211 Encounter for screening for malignant neoplasm of colon: Secondary | ICD-10-CM

## 2019-07-25 DIAGNOSIS — N760 Acute vaginitis: Secondary | ICD-10-CM

## 2019-07-25 DIAGNOSIS — Z113 Encounter for screening for infections with a predominantly sexual mode of transmission: Secondary | ICD-10-CM

## 2019-07-25 NOTE — Progress Notes (Signed)
Patient presents for her Annual Exam today.  LMP: None since age 49.  Last pap: 07/26/2015 No Hx of abnormal paps  STD: Desires Full Panel.   Mammogram: 05/27/2016 WNL   Family Hx of Breast Cancer: None No family Hx of colon Cancer .    CC: None

## 2019-07-25 NOTE — Progress Notes (Signed)
Subjective:        Robin Fleming is a 49 y.o. female here for a routine exam.  Current complaints: None.    Personal health questionnaire:  Is patient [name], have a family history of breast and/or ovarian cancer: no Is there a family history of uterine cancer diagnosed at age < 57, gastrointestinal cancer, urinary tract cancer, family member who is a Field seismologist syndrome-associated carrier: no Is the patient overweight and hypertensive, family history of diabetes, personal history of gestational diabetes, preeclampsia or PCOS: no Is patient over 35, have PCOS,  family history of premature CHD under age 74, diabetes, smoke, have hypertension or peripheral artery disease:  no At any time, has a partner hit, kicked or otherwise hurt or frightened you?: no Over the past 2 weeks, have you felt down, depressed or hopeless?: no Over the past 2 weeks, have you felt little interest or pleasure in doing things?:no   Gynecologic History No LMP recorded (lmp unknown). Patient is postmenopausal. Contraception: post menopausal status Last Pap: 2017. Results were: normal Last mammogram: 2018. Results were: normal  Obstetric History OB History  Gravida Para Term Preterm AB Living  2 2 2     2   SAB TAB Ectopic Multiple Live Births          2    # Outcome Date GA Lbr Len/2nd Weight Sex Delivery Anes PTL Lv  2 Term         LIV  1 Term         LIV    Past Medical History:  Diagnosis Date  . Asthma   . Obesity     Past Surgical History:  Procedure Laterality Date  . MOUTH SURGERY    . TUBAL LIGATION       Current Outpatient Medications:  .  albuterol (PROVENTIL HFA;VENTOLIN HFA) 108 (90 BASE) MCG/ACT inhaler, Inhale 2 puffs into the lungs every 6 (six) hours as needed for wheezing or shortness of breath., Disp: , Rfl:  .  albuterol (PROVENTIL) (2.5 MG/3ML) 0.083% nebulizer solution, Take 2.5 mg by nebulization every 6 (six) hours as needed for wheezing., Disp: , Rfl:  .   Cholecalciferol (VITAMIN D-3 PO), Take 2 tablets by mouth daily., Disp: , Rfl:  .  montelukast (SINGULAIR) 10 MG tablet, Take 10 mg by mouth at bedtime. , Disp: , Rfl:  .  Multiple Vitamins-Minerals (MULTIVITAMIN PO), Take 1 tablet by mouth daily., Disp: , Rfl:  .  QVAR 80 MCG/ACT inhaler, Inhale 2 puffs into the lungs every morning. , Disp: , Rfl: 1 .  CRANBERRY PO, Take 1 tablet by mouth daily. (Patient not taking: Reported on 07/25/2019), Disp: , Rfl:  .  Cyanocobalamin (VITAMIN B-12 PO), Take 2 tablets by mouth daily. Reported on 04/18/2015 (Patient not taking: Reported on 07/25/2019), Disp: , Rfl:  .  diazepam (VALIUM) 5 MG tablet, Take 1 tablet (5 mg total) by mouth 2 (two) times daily as needed (for dizziness). (Patient not taking: Reported on 04/08/2016), Disp: 10 tablet, Rfl: 0 .  hydrochlorothiazide (HYDRODIURIL) 12.5 MG tablet, Take 12.5 mg by mouth daily. (Patient not taking: Reported on 07/25/2019), Disp: , Rfl: 2 .  meclizine (ANTIVERT) 25 MG tablet, Take 1 tablet (25 mg total) by mouth 2 (two) times daily as needed. (Patient not taking: Reported on 04/08/2016), Disp: 30 tablet, Rfl: 0 .  metroNIDAZOLE (FLAGYL) 500 MG tablet, Take 1 tablet (500 mg total) by mouth 2 (two) times daily. (Patient not taking: Reported  on 04/08/2016), Disp: 14 tablet, Rfl: 2 .  valACYclovir (VALTREX) 1000 MG tablet, 1 tab po bid for 3 days, then prn each outbreak. (Patient not taking: Reported on 07/25/2019), Disp: 30 tablet, Rfl: prn Allergies  Allergen Reactions  . Gadolinium      Desc: Pt developed nausea and vomiting directly after hand injecting 17cc Multihance for MRI Brain.     Social History   Tobacco Use  . Smoking status: Never Smoker  . Smokeless tobacco: Never Used  Substance Use Topics  . Alcohol use: No    Family History  Problem Relation Age of Onset  . Hypertension Mother   . Kidney disease Father       Review of Systems  Constitutional: negative for fatigue and weight  loss Respiratory: negative for cough and wheezing Cardiovascular: negative for chest pain, fatigue and palpitations Gastrointestinal: negative for abdominal pain and change in bowel habits Musculoskeletal:negative for myalgias Neurological: negative for gait problems and tremors Behavioral/Psych: negative for abusive relationship, depression Endocrine: negative for temperature intolerance    Genitourinary:negative for abnormal menstrual periods, genital lesions, hot flashes, sexual problems and vaginal discharge Integument/breast: negative for breast lump, breast tenderness, nipple discharge and skin lesion(s)    Objective:       BP (!) 143/97   Pulse 68   Ht 5\' 2"  (1.575 m)   Wt 191 lb (86.6 kg)   LMP  (LMP Unknown) Comment: no cycle since October 2015  BMI 34.93 kg/m  General:   alert  Skin:   no rash or abnormalities  Lungs:   clear to auscultation bilaterally  Heart:   regular rate and rhythm, S1, S2 normal, no murmur, click, rub or gallop  Breasts:   normal without suspicious masses, skin or nipple changes or axillary nodes  Abdomen:  normal findings: no organomegaly, soft, non-tender and no hernia  Pelvis:  External genitalia: normal general appearance Urinary system: urethral meatus normal and bladder without fullness, nontender Vaginal: normal without tenderness, induration or masses Cervix: normal appearance Adnexa: normal bimanual exam Uterus: anteverted and non-tender, normal size   Lab Review Urine pregnancy test Labs reviewed yes Radiologic studies reviewed yes  50% of 20 min visit spent on counseling and coordination of care.   Assessment:     1. Encounter for routine gynecological examination with Papanicolaou smear of cervix Rx: - Cytology - PAP( [facility])  2. Postmenopausal - clinically stable  3. Vaginal discharge Rx: - Cervicovaginal ancillary only  4. Screen for STD (sexually transmitted disease) Rx: - HIV Antibody (routine testing w  rflx) - Hepatitis B surface antigen - RPR - Hepatitis C antibody  5. Screening breast examination Rx: - MM Digital Screening; Future  6. Screening for colon cancer Rx: - Ambulatory referral to Gastroenterology  7. Obesity (BMI 30.0-34.9) - program of caloric reduction, exercise and behavioral modification recommended    Plan:    Education reviewed: calcium supplements, depression evaluation, low fat, low cholesterol diet, safe sex/STD prevention, self breast exams and weight bearing exercise. Mammogram ordered. Follow up in: 1 year. Colonoscopy ordered    Orders Placed This Encounter  Procedures  . MM Digital Screening    Standing Status:   Future    Standing Expiration Date:   07/24/2020    Order Specific Question:   Reason for Exam (SYMPTOM  OR DIAGNOSIS REQUIRED)    Answer:   Screening    Order Specific Question:   Is the patient pregnant?    Answer:   No  Order Specific Question:   Preferred imaging location?    Answer:   [facility]  . HIV Antibody (routine testing w rflx)  . Hepatitis B surface antigen  . RPR  . Hepatitis C antibody  . Ambulatory referral to Gastroenterology    Referral Priority:   Routine    Referral Type:   Consultation    Referral Reason:   Specialty Services Required    Number of Visits Requested:   1    Shelly Bombard, MD 07/25/2019 2:44 PM

## 2019-07-26 ENCOUNTER — Encounter: Payer: Self-pay | Admitting: Gastroenterology

## 2019-07-26 LAB — CERVICOVAGINAL ANCILLARY ONLY
Bacterial Vaginitis (gardnerella): POSITIVE — AB
Candida Glabrata: NEGATIVE
Candida Vaginitis: NEGATIVE
Chlamydia: NEGATIVE
Comment: NEGATIVE
Comment: NEGATIVE
Comment: NEGATIVE
Comment: NEGATIVE
Comment: NEGATIVE
Comment: NORMAL
Neisseria Gonorrhea: NEGATIVE
Trichomonas: NEGATIVE

## 2019-07-26 LAB — CYTOLOGY - PAP
Comment: NEGATIVE
Diagnosis: NEGATIVE
High risk HPV: NEGATIVE

## 2019-07-26 LAB — HEPATITIS B SURFACE ANTIGEN: Hepatitis B Surface Ag: NEGATIVE

## 2019-07-26 LAB — RPR: RPR Ser Ql: NONREACTIVE

## 2019-07-26 LAB — HIV ANTIBODY (ROUTINE TESTING W REFLEX): HIV Screen 4th Generation wRfx: NONREACTIVE

## 2019-07-26 LAB — HEPATITIS C ANTIBODY: Hep C Virus Ab: <0.1 s/co ratio (ref 0.0–0.9)

## 2019-08-09 ENCOUNTER — Ambulatory Visit
Admission: RE | Admit: 2019-08-09 | Discharge: 2019-08-09 | Disposition: A | Discharge status: Home / Self Care | Payer: 59 | Source: Ambulatory Visit | Attending: Obstetrics | Admitting: Obstetrics

## 2019-08-09 ENCOUNTER — Other Ambulatory Visit: Payer: Self-pay

## 2019-08-09 DIAGNOSIS — Z1239 Encounter for other screening for malignant neoplasm of breast: Secondary | ICD-10-CM

## 2019-08-18 ENCOUNTER — Ambulatory Visit (AMBULATORY_SURGERY_CENTER): Payer: Self-pay | Admitting: *Deleted

## 2019-08-18 ENCOUNTER — Other Ambulatory Visit: Payer: Self-pay

## 2019-08-18 VITALS — Ht 62.0 in | Wt 189.0 lb

## 2019-08-18 DIAGNOSIS — Z1211 Encounter for screening for malignant neoplasm of colon: Secondary | ICD-10-CM

## 2019-08-18 MED ORDER — NA SULFATE-K SULFATE-MG SULF 17.5-3.13-1.6 GM/177ML PO SOLN
ORAL | 0 refills | Status: DC
Start: 2019-08-18 — End: 2019-10-06

## 2019-08-18 NOTE — Consult Note (Addendum)
Patient is here in-person for PV. Patient denies any allergies to eggs or soy. Patient denies any problems with anesthesia/sedation. Patient denies any oxygen use at home. Patient denies taking any diet/weight loss medications or blood thinners. Patient is not being treated for MRSA or C-diff. Patient is aware of our care-partner policy and [facility]-62 safety protocol. EMMI education assisgned to the patient for the procedure, this was explained and instructions given to patient via [facility]. Colonoscopy handout given to the patient.    COVID-19 vaccines completed in April 2021 per pt.

## 2019-10-06 ENCOUNTER — Encounter: Payer: Self-pay | Admitting: Gastroenterology

## 2019-10-06 ENCOUNTER — Other Ambulatory Visit: Payer: Self-pay

## 2019-10-06 ENCOUNTER — Ambulatory Visit (AMBULATORY_SURGERY_CENTER): Payer: 59 | Admitting: Gastroenterology

## 2019-10-06 VITALS — BP 149/89 | HR 84 | Temp 97.7°F | Resp 16 | Ht 62.0 in | Wt 189.0 lb

## 2019-10-06 DIAGNOSIS — K635 Polyp of colon: Secondary | ICD-10-CM

## 2019-10-06 DIAGNOSIS — D125 Benign neoplasm of sigmoid colon: Secondary | ICD-10-CM | POA: Diagnosis not present

## 2019-10-06 DIAGNOSIS — Z1211 Encounter for screening for malignant neoplasm of colon: Secondary | ICD-10-CM | POA: Diagnosis present

## 2019-10-06 MED ORDER — SODIUM CHLORIDE 0.9 % IV SOLN: 500.0000 mL | Freq: Once | INTRAVENOUS | Status: DC

## 2019-10-06 NOTE — Patient Instructions (Signed)
Handout on polyps, diverticulosis, and hemorrhoids given, Use Fiber-con 1-2 tablets daily.   YOU HAD AN ENDOSCOPIC PROCEDURE TODAY AT [facility]:   Refer to the procedure report that was given to you for any specific questions about what was found during the examination.  If the procedure report does not answer your questions, please call your gastroenterologist to clarify.  If you requested that your care partner not be given the details of your procedure findings, then the procedure report has been included in a sealed envelope for you to review at your convenience later.  YOU SHOULD EXPECT: Some feelings of bloating in the abdomen. Passage of more gas than usual.  Walking can help get rid of the air that was put into your GI tract during the procedure and reduce the bloating. If you had a lower endoscopy (such as a colonoscopy or flexible sigmoidoscopy) you may notice spotting of blood in your stool or on the toilet paper. If you underwent a bowel prep for your procedure, you may not have a normal bowel movement for a few days.  Please Note:  You might notice some irritation and congestion in your nose or some drainage.  This is from the oxygen used during your procedure.  There is no need for concern and it should clear up in a day or so.  SYMPTOMS TO REPORT IMMEDIATELY:   Following lower endoscopy (colonoscopy or flexible sigmoidoscopy):  Excessive amounts of blood in the stool  Significant tenderness or worsening of abdominal pains  Swelling of the abdomen that is new, acute  Fever of 100F or higher   For urgent or emergent issues, a gastroenterologist can be reached at any hour by calling [phone]. Do not use MyChart messaging for urgent concerns.    DIET:  We do recommend a small meal at first, but then you may proceed to your regular diet.  Drink plenty of fluids but you should avoid alcoholic beverages for 24 hours.  ACTIVITY:  You should plan to take it  easy for the rest of today and you should NOT DRIVE or use heavy machinery until tomorrow (because of the sedation medicines used during the test).    FOLLOW UP: Our staff will call the number listed on your records 48-72 hours following your procedure to check on you and address any questions or concerns that you may have regarding the information given to you following your procedure. If we do not reach you, we will leave a message.  We will attempt to reach you two times.  During this call, we will ask if you have developed any symptoms of COVID 19. If you develop any symptoms (ie: fever, flu-like symptoms, shortness of breath, cough etc.) before then, please call [phone].  If you test positive for Covid 19 in the 2 weeks post procedure, please call and report this information to [place].    If any biopsies were taken you will be contacted by phone or by letter within the next 1-3 weeks.  Please call us at [phone] if you have not heard about the biopsies in 3 weeks.    SIGNATURES/CONFIDENTIALITY: You and/or your care partner have signed paperwork which will be entered into your electronic medical record.  These signatures attest to the fact that that the information above on your After Visit Summary has been reviewed and is understood.  Full responsibility of the confidentiality of this discharge information lies with you and/or your care-partner.

## 2019-10-06 NOTE — Progress Notes (Signed)
PT taken to PACU. Monitors in place. VSS. Report given to RN. 

## 2019-10-06 NOTE — Op Note (Signed)
[place] Patient Name: Robin Fleming Procedure Date: 10/06/2019 11:08 AM MRN: [id number] Endoscopist: Justice Britain , MD Age: 49 Referring MD:  Date of Birth: 10/26/1970 Gender: Female Account #: [id number] Procedure:                Colonoscopy Indications:              Screening for colorectal malignant neoplasm, This                            is the patient's first colonoscopy Medicines:                Monitored Anesthesia Care Procedure:                Pre-Anesthesia Assessment:                           - Prior to the procedure, a History and Physical                            was performed, and patient medications and                            allergies were reviewed. The patient's tolerance of                            previous anesthesia was also reviewed. The risks                            and benefits of the procedure and the sedation                            options and risks were discussed with the patient.                            All questions were answered, and informed consent                            was obtained. Prior Anticoagulants: The patient has                            taken no previous anticoagulant or antiplatelet                            agents. ASA Grade Assessment: II - A patient with                            mild systemic disease. After reviewing the risks                            and benefits, the patient was deemed in                            satisfactory condition to undergo the procedure.  After obtaining informed consent, the colonoscope                            was passed under direct vision. Throughout the                            procedure, the patient's blood pressure, pulse, and                            oxygen saturations were monitored continuously. The                            Colonoscope was introduced through the anus and                            advanced to the 5 cm  into the ileum. The                            colonoscopy was performed without difficulty. The                            patient tolerated the procedure. The quality of the                            bowel preparation was good. The terminal ileum,                            ileocecal valve, appendiceal orifice, and rectum                            were photographed. Scope In: 11:22:51 AM Scope Out: 11:35:24 AM Scope Withdrawal Time: 0 hours 8 minutes 30 seconds  Total Procedure Duration: 0 hours 12 minutes 33 seconds  Findings:                 The digital rectal exam findings include                            hemorrhoids. Pertinent negatives include no                            palpable rectal lesions.                           The terminal ileum and ileocecal valve appeared                            normal.                           A 3 mm polyp was found in the sigmoid colon. The                            polyp was sessile. The polyp was removed with a  cold snare. Resection and retrieval were complete.                           Multiple small-mouthed diverticula were found in                            the recto-sigmoid colon, sigmoid colon, descending                            colon and ascending colon (left colon > right                            colon).                           Normal mucosa was found in the entire colon                            otherwise.                           Non-bleeding non-thrombosed internal hemorrhoids                            were found during retroflexion, during perianal                            exam and during digital exam. The hemorrhoids were                            Grade II (internal hemorrhoids that prolapse but                            reduce spontaneously). Complications:            No immediate complications. Estimated Blood Loss:     Estimated blood loss was minimal. Impression:                - Hemorrhoids found on digital rectal exam.                           - The examined portion of the ileum was normal.                           - One 3 mm polyp in the sigmoid colon, removed with                            a cold snare. Resected and retrieved.                           - Diverticulosis in the recto-sigmoid colon, in the                            sigmoid colon, in the descending colon and in the  ascending colon (Lt > Rt colon).                           - Normal mucosa in the entire examined colon                            otherwise.                           - Non-bleeding non-thrombosed internal hemorrhoids. Recommendation:           - The patient will be observed post-procedure,                            until all discharge criteria are met.                           - Discharge patient to home.                           - Patient has a contact number available for                            emergencies. The signs and symptoms of potential                            delayed complications were discussed with the                            patient. Return to normal activities tomorrow.                            Written discharge instructions were provided to the                            patient.                           - High fiber diet.                           - Use FiberCon 1-2 tablets PO daily.                           - Continue present medications.                           - Await pathology results.                           - Repeat colonoscopy in 7/10 years for surveillance                            based on pathology results and findings of                            adenomatous tissue.                           -  The findings and recommendations were discussed                            with the patient. Justice Britain, MD 10/06/2019 11:42:04 AM

## 2019-10-06 NOTE — Progress Notes (Signed)
Called to room to assist during endoscopic procedure.  Patient ID and intended procedure confirmed with present staff. Received instructions for my participation in the procedure from the performing physician.  

## 2019-10-06 NOTE — Progress Notes (Signed)
Pt's states no medical or surgical changes since previsit or office visit.  JK - vitals

## 2019-10-10 ENCOUNTER — Telehealth: Payer: Self-pay

## 2019-10-10 NOTE — Telephone Encounter (Signed)
1st follow up call made.  NAULM 

## 2019-10-10 NOTE — Telephone Encounter (Signed)
  Follow up Call-  Call back number 10/06/2019  Post procedure Call Back phone  # [phone]  Permission to leave phone message Yes  Some recent data might be hidden     Patient questions:  Do you have a fever, pain , or abdominal swelling? No. Pain Score  0 *  Have you tolerated food without any problems? Yes.    Have you been able to return to your normal activities? Yes.    Do you have any questions about your discharge instructions: Diet   No. Medications  No. Follow up visit  No.  Do you have questions or concerns about your Care? No.  Actions: * If pain score is 4 or above: No action needed, pain <4.  1. Have you developed a fever since your procedure? no  2.   Have you had an respiratory symptoms (SOB or cough) since your procedure? no  3.   Have you tested positive for COVID 19 since your procedure no  4.   Have you had any family members/close contacts diagnosed with the COVID 19 since your procedure?  no   If yes to any of these questions please route to Joylene John, RN and Joella Prince, RN

## 2019-10-19 ENCOUNTER — Encounter: Payer: Self-pay | Admitting: Gastroenterology

## 2020-09-10 ENCOUNTER — Other Ambulatory Visit: Payer: Self-pay | Admitting: Internal Medicine

## 2020-09-10 DIAGNOSIS — Z1231 Encounter for screening mammogram for malignant neoplasm of breast: Secondary | ICD-10-CM

## 2020-10-30 ENCOUNTER — Other Ambulatory Visit: Payer: Self-pay

## 2020-10-30 ENCOUNTER — Ambulatory Visit
Admission: RE | Admit: 2020-10-30 | Discharge: 2020-10-30 | Disposition: A | Discharge status: Home / Self Care | Payer: 59 | Source: Ambulatory Visit

## 2020-10-30 DIAGNOSIS — Z1231 Encounter for screening mammogram for malignant neoplasm of breast: Secondary | ICD-10-CM

## 2020-12-03 ENCOUNTER — Encounter: Payer: Self-pay | Admitting: Obstetrics

## 2020-12-03 ENCOUNTER — Ambulatory Visit (INDEPENDENT_AMBULATORY_CARE_PROVIDER_SITE_OTHER): Payer: 59 | Admitting: Obstetrics

## 2020-12-03 ENCOUNTER — Other Ambulatory Visit (HOSPITAL_COMMUNITY)
Admission: RE | Admit: 2020-12-03 | Discharge: 2020-12-03 | Disposition: A | Discharge status: Home / Self Care | Payer: 59 | Source: Ambulatory Visit | Attending: Obstetrics | Admitting: Obstetrics

## 2020-12-03 ENCOUNTER — Other Ambulatory Visit: Payer: Self-pay

## 2020-12-03 VITALS — BP 137/90 | HR 67 | Ht 62.0 in | Wt 180.0 lb

## 2020-12-03 DIAGNOSIS — Z780 Asymptomatic menopausal state: Secondary | ICD-10-CM

## 2020-12-03 DIAGNOSIS — E669 Obesity, unspecified: Secondary | ICD-10-CM

## 2020-12-03 DIAGNOSIS — Z01419 Encounter for gynecological examination (general) (routine) without abnormal findings: Secondary | ICD-10-CM | POA: Diagnosis present

## 2020-12-03 NOTE — Progress Notes (Signed)
GYN presents for AEX/PAP. Reports no complaints today.

## 2020-12-03 NOTE — Progress Notes (Signed)
Subjective:        Robin Fleming is a 50 y.o. female here for a routine exam.  Current complaints: None.    Personal health questionnaire:  Is patient [name], have a family history of breast and/or ovarian cancer: no Is there a family history of uterine cancer diagnosed at age < 56, gastrointestinal cancer, urinary tract cancer, family member who is a Field seismologist syndrome-associated carrier: no Is the patient overweight and hypertensive, family history of diabetes, personal history of gestational diabetes, preeclampsia or PCOS: no Is patient over 67, have PCOS,  family history of premature CHD under age 5, diabetes, smoke, have hypertension or peripheral artery disease:  no At any time, has a partner hit, kicked or otherwise hurt or frightened you?: no Over the past 2 weeks, have you felt down, depressed or hopeless?: no Over the past 2 weeks, have you felt little interest or pleasure in doing things?:no   Gynecologic History No LMP recorded (lmp unknown). Patient is postmenopausal. Contraception: post menopausal status Last Pap: 07-25-2019. Results were: normal Last mammogram: 10-30-2020. Results were: normal  Obstetric History OB History  Gravida Para Term Preterm AB Living  2 2 2     2   SAB IAB Ectopic Multiple Live Births          2    # Outcome Date GA Lbr Len/2nd Weight Sex Delivery Anes PTL Lv  2 Term         LIV  1 Term         LIV    Past Medical History:  Diagnosis Date   Asthma    Obesity    Sleep apnea    not using CPAP    Past Surgical History:  Procedure Laterality Date   MOUTH SURGERY     TUBAL LIGATION       Current Outpatient Medications:    albuterol (PROVENTIL HFA;VENTOLIN HFA) 108 (90 BASE) MCG/ACT inhaler, Inhale 2 puffs into the lungs every 6 (six) hours as needed for wheezing or shortness of breath. (Patient not taking: Reported on 08/18/2019), Disp: , Rfl:    albuterol (PROVENTIL) (2.5 MG/3ML) 0.083% nebulizer solution, Take 2.5 mg by  nebulization every 6 (six) hours as needed for wheezing. (Patient not taking: Reported on 08/18/2019), Disp: , Rfl:    APPLE CIDER VINEGAR PO, Take by mouth. (Patient not taking: Reported on 10/06/2019), Disp: , Rfl:    Cholecalciferol (VITAMIN D-3 PO), Take 2 tablets by mouth daily. (Patient not taking: Reported on 08/18/2019), Disp: , Rfl:    CRANBERRY PO, Take 1 tablet by mouth daily. (Patient not taking: Reported on 07/25/2019), Disp: , Rfl:    Cyanocobalamin (VITAMIN B-12 PO), Take 2 tablets by mouth daily. Reported on 04/18/2015 (Patient not taking: Reported on 07/25/2019), Disp: , Rfl:    ELDERBERRY PO, Take by mouth. (Patient not taking: Reported on 10/06/2019), Disp: , Rfl:    Multiple Vitamins-Minerals (MULTIVITAMIN PO), Take 1 tablet by mouth daily. (Patient not taking: Reported on 10/06/2019), Disp: , Rfl:    QVAR 80 MCG/ACT inhaler, Inhale 2 puffs into the lungs every morning.  (Patient not taking: Reported on 08/18/2019), Disp: , Rfl: 1 Allergies  Allergen Reactions   Gadolinium      Desc: Pt developed nausea and vomiting directly after hand injecting 17cc Multihance for MRI Brain.    Other Nausea And Vomiting    gadolium- Desc: Pt developed nausea and vomiting directly after hand injecting 17cc Multihance for MRI Brain.  Social History   Tobacco Use   Smoking status: Never   Smokeless tobacco: Never  Substance Use Topics   Alcohol use: Yes    Comment: occ    Family History  Problem Relation Age of Onset   Hypertension Mother    Kidney disease Father    Colon cancer Neg Hx    Colon polyps Neg Hx    Esophageal cancer Neg Hx    Stomach cancer Neg Hx    Rectal cancer Neg Hx       Review of Systems  Constitutional: negative for fatigue and weight loss Respiratory: negative for cough and wheezing Cardiovascular: negative for chest pain, fatigue and palpitations Gastrointestinal: negative for abdominal pain and change in bowel habits Musculoskeletal:negative for  myalgias Neurological: negative for gait problems and tremors Behavioral/Psych: negative for abusive relationship, depression Endocrine: negative for temperature intolerance    Genitourinary:negative for abnormal menstrual periods, genital lesions, hot flashes, sexual problems and vaginal discharge Integument/breast: negative for breast lump, breast tenderness, nipple discharge and skin lesion(s)    Objective:       BP 137/90 (BP Location: Left Arm, Cuff Size: Large)   Pulse 67   Ht 5\' 2"  (1.575 m)   Wt 180 lb (81.6 kg)   LMP  (LMP Unknown) Comment: no cycle since October 2015  BMI 32.92 kg/m  General:   Alert and no distress  Skin:   no rash or abnormalities  Lungs:   clear to auscultation bilaterally  Heart:   regular rate and rhythm, S1, S2 normal, no murmur, click, rub or gallop  Breasts:   normal without suspicious masses, skin or nipple changes or axillary nodes  Abdomen:  normal findings: no organomegaly, soft, non-tender and no hernia  Pelvis:  External genitalia: normal general appearance Urinary system: urethral meatus normal and bladder without fullness, nontender Vaginal: normal without tenderness, induration or masses Cervix: normal appearance Adnexa: normal bimanual exam Uterus: anteverted and non-tender, normal size   Lab Review Urine pregnancy test Labs reviewed yes Radiologic studies reviewed yes   Assessment:    1. Encounter for routine gynecological examination with Papanicolaou smear of cervix Rx: - Cytology - PAP( [facility])  2. Postmenopause - doing well  3. Obesity (BMI 30.0-34.9) - weight reduction with the aid of dietary changes, exercise and behavioral modification recommended     Plan:    Education reviewed: calcium supplements, depression evaluation, low fat, low cholesterol diet, safe sex/STD prevention, self breast exams, and weight bearing exercise. Follow up in: 1 year.     Shelly Bombard, MD 12/03/2020 3:26 PM

## 2020-12-04 LAB — CYTOLOGY - PAP
Comment: NEGATIVE
Diagnosis: NEGATIVE
High risk HPV: NEGATIVE

## 2021-01-06 ENCOUNTER — Encounter: Payer: Self-pay | Admitting: Diagnostic Neuroimaging

## 2021-01-06 ENCOUNTER — Ambulatory Visit (INDEPENDENT_AMBULATORY_CARE_PROVIDER_SITE_OTHER): Payer: 59 | Admitting: Diagnostic Neuroimaging

## 2021-01-06 ENCOUNTER — Other Ambulatory Visit: Payer: Self-pay

## 2021-01-06 VITALS — BP 122/72 | HR 83 | Ht 62.0 in | Wt 176.0 lb

## 2021-01-06 DIAGNOSIS — R200 Anesthesia of skin: Secondary | ICD-10-CM

## 2021-01-06 DIAGNOSIS — R202 Paresthesia of skin: Secondary | ICD-10-CM | POA: Diagnosis not present

## 2021-01-06 NOTE — Progress Notes (Signed)
[facility]  PATIENT: Robin Fleming DOB: [birth year]  REFERRING CLINICIAN: Nolene Ebbs, MD HISTORY FROM: PATIENT  REASON FOR VISIT: NEW CONSULT    HISTORICAL  CHIEF COMPLAINT:  Chief Complaint  Patient presents with   New Patient (Initial Visit)    Rm 6 alone here for consult on worsening Numbness/Paraesthesia-  Pt reports in her left hand will go numb/tingle for a while and then subside. Pt also reports sx will radiate into the upper part of her arm.     HISTORY OF PRESENT ILLNESS:   50 year old female here for evaluation of left hand numbness.  Symptoms started 1 year ago (fall 2021).  She has intermittent numbness and tingling in her left hand, palmar region radiating to the third digit.  Symptoms seem to be aggravated after she eats food.  She feels a tightness sensation in the left arm and sometimes she has to shake it to relieve symptoms.  No problems with right hand or legs.  Sometimes she feels a discomfort in her left upper quadrant abdominal region.   REVIEW OF SYSTEMS: Full 14 system review of systems performed and negative with exception of: as per HPI.  ALLERGIES: Allergies  Allergen Reactions   Dulera [Mometasone Furo-Formoterol Fum]     Jittery   Gadolinium      Desc: Pt developed nausea and vomiting directly after hand injecting 17cc Multihance for MRI Brain.    Other Nausea And Vomiting    gadolium- Desc: Pt developed nausea and vomiting directly after hand injecting 17cc Multihance for MRI Brain.    HOME MEDICATIONS: Outpatient Medications Prior to Visit  Medication Sig Dispense Refill   albuterol (VENTOLIN) 2 MG/5ML syrup Take 2 mg by mouth 3 (three) times daily.     APPLE CIDER VINEGAR PO Take by mouth.     MAGNESIUM PO Take by mouth.     VITAMIN D PO Take by mouth.     albuterol (PROVENTIL HFA;VENTOLIN HFA) 108 (90 BASE) MCG/ACT inhaler Inhale 2 puffs into the lungs every 6 (six) hours as needed for wheezing or shortness of  breath.     albuterol (PROVENTIL) (2.5 MG/3ML) 0.083% nebulizer solution Take 2.5 mg by nebulization every 6 (six) hours as needed for wheezing.     No facility-administered medications prior to visit.    PAST MEDICAL HISTORY: Past Medical History:  Diagnosis Date   Anemia    Asthma    Asthma    Depression    Hypertension    Menopause    Numbness    Obesity    Optic neuritis    Sleep apnea    not using CPAP   Sleep apnea     PAST SURGICAL HISTORY: Past Surgical History:  Procedure Laterality Date   MOUTH SURGERY     TUBAL LIGATION      FAMILY HISTORY: Family History  Problem Relation Age of Onset   Hypertension Mother    Kidney disease Father    Colon cancer Neg Hx    Colon polyps Neg Hx    Esophageal cancer Neg Hx    Stomach cancer Neg Hx    Rectal cancer Neg Hx     SOCIAL HISTORY: Social History   Socioeconomic History   Marital status: Widowed    Spouse name: Not on file   Number of children: Not on file   Years of education: Not on file   Highest education level: Not on file  Occupational History   Not on file  Tobacco Use   Smoking status: Never   Smokeless tobacco: Never  Vaping Use   Vaping Use: Never used  Substance and Sexual Activity   Alcohol use: Not Currently    Comment: occ   Drug use: No   Sexual activity: Yes    Partners: Male    Birth control/protection: Surgical  Other Topics Concern   Not on file  Social History Narrative   No caffeine use    Social Determinants of Radio broadcast assistant Strain: Not on file  Food Insecurity: Not on file  Transportation Needs: Not on file  Physical Activity: Not on file  Stress: Not on file  Social Connections: Not on file  Intimate Partner Violence: Not on file     PHYSICAL EXAM  GENERAL EXAM/CONSTITUTIONAL: Vitals:  Vitals:   01/06/21 0831  BP: 122/72  Pulse: 83  SpO2: 97%  Weight: 176 lb (79.8 kg)  Height: 5\' 2"  (1.575 m)   Body mass index is 32.19 kg/m. Wt  Readings from Last 3 Encounters:  01/06/21 176 lb (79.8 kg)  12/03/20 180 lb (81.6 kg)  10/06/19 189 lb (85.7 kg)   Patient is in no distress; well developed, nourished and groomed; neck is supple  CARDIOVASCULAR: Examination of carotid arteries is normal; no carotid bruits Regular rate and rhythm, no murmurs Examination of peripheral vascular system by observation and palpation is normal  EYES: Ophthalmoscopic exam of optic discs and posterior segments is normal; no papilledema or hemorrhages No results found.  MUSCULOSKELETAL: Gait, strength, tone, movements noted in Neurologic exam below  NEUROLOGIC: MENTAL STATUS:  No flowsheet data found. awake, alert, oriented to person, place and time recent and remote memory intact normal attention and concentration language fluent, comprehension intact, naming intact fund of knowledge appropriate  CRANIAL NERVE:  2nd - no papilledema on fundoscopic exam 2nd, 3rd, 4th, 6th - pupils equal and reactive to light, visual fields full to confrontation, extraocular muscles intact, no nystagmus 5th - facial sensation symmetric 7th - facial strength symmetric 8th - hearing intact 9th - palate elevates symmetrically, uvula midline 11th - shoulder shrug symmetric 12th - tongue protrusion midline  MOTOR:  normal bulk and tone, full strength in the BUE, BLE; EXCEPT SLIGHT DECR LEFT THUMB ABDUCTION BORDERLINE PHALENS ON LEFT  SENSORY:  normal and symmetric to light touch, pinprick, temperature, vibration  COORDINATION:  finger-nose-finger, fine finger movements normal  REFLEXES:  deep tendon reflexes present and symmetric  GAIT/STATION:  narrow based gait     DIAGNOSTIC DATA (LABS, IMAGING, TESTING) - I reviewed patient records, labs, notes, testing and imaging myself where available.  Lab Results  Component Value Date   WBC 9.2 04/18/2015   HGB 13.8 04/18/2015   HCT 41.5 04/18/2015   MCV 95.8 04/18/2015   PLT 284  04/18/2015      Component Value Date/Time   NA 138 04/18/2015 1838   K 4.0 04/18/2015 1838   CL 102 04/18/2015 1838   CO2 26 04/18/2015 1838   GLUCOSE 109 (H) 04/18/2015 1838   BUN 8 04/18/2015 1838   CREATININE 0.70 04/18/2015 1838   CALCIUM 9.3 04/18/2015 1838   GFRNONAA >60 04/18/2015 1838   GFRAA >60 04/18/2015 1838   No results found for: CHOL, HDL, LDLCALC, LDLDIRECT, TRIG, CHOLHDL No results found for: HGBA1C No results found for: VITAMINB12 Lab Results  Component Value Date   TSH 1.120 11/16/2012       ASSESSMENT AND PLAN  50 y.o. year old female here  with intermittent numbness and tingling in left hand, suspicious for peripheral process such as carpal tunnel syndrome.   Dx:  1. Numbness and tingling in left hand      PLAN:  -Check EMG nerve conduction -Advised patient to try wrist splint at bedtime   Orders Placed This Encounter  Procedures   NCV with EMG(electromyography)   Return for for NCV/EMG.    Penni Bombard, MD 93/81/8299, 3:71 AM Certified in Neurology, Neurophysiology and Neuroimaging  [facility] [address] [place], [place] [address] [phone]

## 2021-01-26 ENCOUNTER — Emergency Department (HOSPITAL_COMMUNITY)
Admission: EM | Admit: 2021-01-26 | Discharge: 2021-01-26 | Disposition: A | Discharge status: Home / Self Care | Payer: 59 | Attending: Emergency Medicine | Admitting: Emergency Medicine

## 2021-01-26 ENCOUNTER — Encounter (HOSPITAL_COMMUNITY): Payer: Self-pay | Admitting: Emergency Medicine

## 2021-01-26 ENCOUNTER — Other Ambulatory Visit: Payer: Self-pay

## 2021-01-26 DIAGNOSIS — S81851A Open bite, right lower leg, initial encounter: Secondary | ICD-10-CM

## 2021-01-26 DIAGNOSIS — Z23 Encounter for immunization: Secondary | ICD-10-CM | POA: Insufficient documentation

## 2021-01-26 DIAGNOSIS — S81811A Laceration without foreign body, right lower leg, initial encounter: Secondary | ICD-10-CM | POA: Diagnosis not present

## 2021-01-26 DIAGNOSIS — S8991XA Unspecified injury of right lower leg, initial encounter: Secondary | ICD-10-CM | POA: Diagnosis present

## 2021-01-26 DIAGNOSIS — I10 Essential (primary) hypertension: Secondary | ICD-10-CM | POA: Diagnosis not present

## 2021-01-26 DIAGNOSIS — W540XXA Bitten by dog, initial encounter: Secondary | ICD-10-CM

## 2021-01-26 DIAGNOSIS — J45909 Unspecified asthma, uncomplicated: Secondary | ICD-10-CM | POA: Diagnosis not present

## 2021-01-26 MED ORDER — AMOXICILLIN-POT CLAVULANATE 875-125 MG PO TABS: 1.0000 | ORAL_TABLET | Freq: Two times a day (BID) | ORAL | 0 refills | Status: AC

## 2021-01-26 MED ORDER — TETANUS-DIPHTH-ACELL PERTUSSIS 5-2.5-18.5 LF-MCG/0.5 IM SUSY
0.5000 mL | PREFILLED_SYRINGE | Freq: Once | INTRAMUSCULAR | Status: AC
  Administered 2021-01-26: 19:00:00 0.5 mL via INTRAMUSCULAR
  Filled 2021-01-26: qty 0.5

## 2021-01-26 NOTE — ED Triage Notes (Signed)
C/o dog bite to R lower leg.  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  Unknown if dog is vaccinated.

## 2021-01-26 NOTE — Discharge Instructions (Signed)
Watch carefully for any sign of infection.  You do not have to have rabies vaccines if animal is found and it has had rabies shots

## 2021-02-07 NOTE — ED Provider Notes (Signed)
[place]    CSN: [id number] Arrival date & time: 01/26/21  1740      History   Chief Complaint Chief Complaint  Patient presents with   Animal Bite    HPI Robin Fleming is a 50 y.o. female.   The history is provided by the patient. No language interpreter was used.  Animal Bite Contact animal:  Unable to specify Location:  Leg Leg injury location:  R lower leg Time since incident:  2 hours Pain details:    Quality:  Aching   Severity:  Moderate   Timing:  Constant Provoked: unprovoked   Notifications:  Animal control Tetanus status:  Up to date Relieved by:  Nothing  Past Medical History:  Diagnosis Date   Anemia    Asthma    Asthma    Depression    Hypertension    Menopause    Numbness    Obesity    Optic neuritis    Sleep apnea    not using CPAP   Sleep apnea     Patient Active Problem List   Diagnosis Date Noted   Early menopause 11/22/2012   Stress incontinence in female 05/18/2012   Irregular menstrual cycle 05/18/2012    Past Surgical History:  Procedure Laterality Date   MOUTH SURGERY     TUBAL LIGATION      OB History     Gravida  2   Para  2   Term  2   Preterm      AB      Living  2      SAB      IAB      Ectopic      Multiple      Live Births  2            Home Medications    Prior to Admission medications   Medication Sig Start Date End Date Taking? Authorizing Provider  albuterol (VENTOLIN) 2 MG/5ML syrup Take 2 mg by mouth 3 (three) times daily.    [provider]  APPLE CIDER VINEGAR PO Take by mouth.    [provider]  MAGNESIUM PO Take by mouth.    [provider]  VITAMIN D PO Take by mouth.    [provider]    Family History Family History  Problem Relation Age of Onset   Hypertension Mother    Kidney disease Father    Colon cancer Neg Hx    Colon polyps Neg Hx    Esophageal cancer Neg Hx    Stomach cancer Neg Hx    Rectal cancer  Neg Hx     Social History Social History   Tobacco Use   Smoking status: Never   Smokeless tobacco: Never  Vaping Use   Vaping Use: Never used  Substance Use Topics   Alcohol use: Not Currently    Comment: occ   Drug use: No     Allergies   Dulera [mometasone furo-formoterol fum], Gadolinium, and Other   Review of Systems Review of Systems  All other systems reviewed and are negative.   Physical Exam Triage Vital Signs ED Triage Vitals  Enc Vitals Group     BP 01/26/21 1819 (!) 143/99     Pulse Rate 01/26/21 1819 65     Resp 01/26/21 1819 18     Temp 01/26/21 1819 98.7 F (37.1 C)     Temp Source 01/26/21 1819 Oral  SpO2 01/26/21 1819 98 %     Weight --      Height --      Head Circumference --      Peak Flow --      Pain Score 01/26/21 1837 7     Pain Loc --      Pain Edu? --      Excl. in [place]? --    No data found.  Updated Vital Signs BP (!) 143/99 (BP Location: Right Arm)    Pulse 65    Temp 98.7 F (37.1 C) (Oral)    Resp 18    LMP  (LMP Unknown) Comment: no cycle since October 2015   SpO2 98%   Visual Acuity Right Eye Distance:   Left Eye Distance:   Bilateral Distance:    Right Eye Near:   Left Eye Near:    Bilateral Near:     Physical Exam Vitals and nursing note reviewed.  Constitutional:      Appearance: She is well-developed.  HENT:     Head: Normocephalic.  Cardiovascular:     Rate and Rhythm: Normal rate.  Pulmonary:     Effort: Pulmonary effort is normal.  Abdominal:     General: There is no distension.  Musculoskeletal:     Cervical back: Normal range of motion.     Comments: Laceration right lower leg.  From  nv and ns intact   Neurological:     Mental Status: She is alert and oriented to person, place, and time.     UC Treatments / Results  Labs (all labs ordered are listed, but only abnormal results are displayed) Labs Reviewed - No data to display  EKG   Radiology No results found.  Procedures Procedures  (including critical care time)  Medications Ordered in UC Medications  Tdap (BOOSTRIX) injection 0.5 mL (0.5 mLs Intramuscular Given 01/26/21 1922)    Initial Impression / Assessment and Plan / UC Course  I have reviewed the triage vital signs and the nursing notes.  Pertinent labs & imaging results that were available during my care of the patient were reviewed by me and considered in my medical decision making (see chart for details).      Final Clinical Impressions(s) / UC Diagnoses   Final diagnoses:  Dog bite of right lower leg, initial encounter     Discharge Instructions      Watch carefully for any sign of infection.  You do not have to have rabies vaccines if animal is found and it has had rabies shots    ED Prescriptions     Medication Sig Dispense Auth. Provider   amoxicillin-clavulanate (AUGMENTIN) 875-125 MG tablet Take 1 tablet by mouth 2 (two) times daily for 10 days. 20 tablet Fransico Meadow, [place]      PDMP not reviewed this encounter.   Fransico Meadow, [place] 02/07/21 [phone]

## 2021-02-12 NOTE — ED Provider Notes (Incomplete)
°  [facility] EMERGENCY DEPARTMENT Provider Note   CSN: [id number] Arrival date & time: 01/26/21  1740     History {Add pertinent medical, surgical, social history, OB history to HPI:1} Chief Complaint  Patient presents with   Animal Bite    Robin Fleming is a 51 y.o. female.   Animal Bite     Home Medications Prior to Admission medications   Medication Sig Start Date End Date Taking? Authorizing Provider  albuterol (VENTOLIN) 2 MG/5ML syrup Take 2 mg by mouth 3 (three) times daily.    [provider]  APPLE CIDER VINEGAR PO Take by mouth.    [provider]  MAGNESIUM PO Take by mouth.    [provider]  VITAMIN D PO Take by mouth.    [provider]      Allergies    Ruthe Mannan [mometasone furo-formoterol fum], Gadolinium, and Other    Review of Systems   Review of Systems  Physical Exam Updated Vital Signs BP (!) 143/99 (BP Location: Right Arm)    Pulse 65    Temp 98.7 F (37.1 C) (Oral)    Resp 18    LMP  (LMP Unknown) Comment: no cycle since October 2015   SpO2 98%  Physical Exam  ED Results / Procedures / Treatments   Labs (all labs ordered are listed, but only abnormal results are displayed) Labs Reviewed - No data to display  EKG None  Radiology No results found.  Procedures Procedures  {Document cardiac monitor, telemetry assessment procedure when appropriate:1}  Medications Ordered in ED Medications  Tdap (BOOSTRIX) injection 0.5 mL (0.5 mLs Intramuscular Given 01/26/21 1922)    ED Course/ Medical Decision Making/ A&P                           Medical Decision Making  ***  {Document critical care time when appropriate:1} {Document review of labs and clinical decision tools ie heart score, Chads2Vasc2 etc:1}  {Document your independent review of radiology images, and any outside records:1} {Document your discussion with family members, caretakers, and with consultants:1} {Document  social determinants of health affecting pt's care:1} {Document your decision making why or why not admission, treatments were needed:1} Final Clinical Impression(s) / ED Diagnoses Final diagnoses:  Dog bite of right lower leg, initial encounter    Rx / DC Orders ED Discharge Orders          Ordered    amoxicillin-clavulanate (AUGMENTIN) 875-125 MG tablet  2 times daily        01/26/21 1849

## 2021-02-17 NOTE — ED Provider Notes (Signed)
°  [facility] EMERGENCY DEPARTMENT Provider Note   CSN: [id number] Arrival date & time: 01/26/21  1740     History  Chief Complaint  Patient presents with   Animal Bite    Robin Fleming is a 51 y.o. female.  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  Pt was bitten by a dog.  Dog is known.  Animal control aware.   The history is provided by the patient. No language interpreter was used.  Animal Bite Contact animal:  Dog     Home Medications Prior to Admission medications   Medication Sig Start Date End Date Taking? Authorizing Provider  albuterol (VENTOLIN) 2 MG/5ML syrup Take 2 mg by mouth 3 (three) times daily.    [provider]  APPLE CIDER VINEGAR PO Take by mouth.    [provider]  MAGNESIUM PO Take by mouth.    [provider]  VITAMIN D PO Take by mouth.    [provider]      Allergies    Ruthe Mannan [mometasone furo-formoterol fum], Gadolinium, and Other    Review of Systems   Review of Systems  Skin:  Positive for wound.  All other systems reviewed and are negative.  Physical Exam Updated Vital Signs BP (!) 143/99 (BP Location: Right Arm)    Pulse 65    Temp 98.7 F (37.1 C) (Oral)    Resp 18    LMP  (LMP Unknown) Comment: no cycle since October 2015   SpO2 98%  Physical Exam Vitals reviewed.  Constitutional:      Appearance: Normal appearance.  Cardiovascular:     Rate and Rhythm: Normal rate.     Pulses: Normal pulses.  Pulmonary:     Effort: Pulmonary effort is normal.  Abdominal:     General: Abdomen is flat.  Musculoskeletal:        General: Normal range of motion.     Comments: 2cm wound right lower leg,   Neurological:     General: No focal deficit present.     Mental Status: She is alert.  Psychiatric:        Mood and Affect: Mood normal.    ED Results / Procedures / Treatments   Labs (all labs ordered are listed, but only abnormal results are displayed) Labs Reviewed -  No data to display  EKG None  Radiology No results found.  Procedures Procedures    Medications Ordered in ED Medications  Tdap (BOOSTRIX) injection 0.5 mL (0.5 mLs Intramuscular Given 01/26/21 1922)    ED Course/ Medical Decision Making/ A&P                           Medical Decision Making  MDM:  Wound cleaned,  pt given rx for antibiotic        Final Clinical Impression(s) / ED Diagnoses Final diagnoses:  Dog bite of right lower leg, initial encounter    Rx / DC Orders ED Discharge Orders          Ordered    amoxicillin-clavulanate (AUGMENTIN) 875-125 MG tablet  2 times daily        01/26/21 1849          An After Visit Summary was printed and given to the patient.     Fransico Meadow, [place] 02/17/21 1453    Fredia Sorrow, MD 02/20/21 [phone]

## 2021-02-27 ENCOUNTER — Ambulatory Visit: Payer: BC Managed Care – PPO | Admitting: Diagnostic Neuroimaging

## 2021-03-05 ENCOUNTER — Ambulatory Visit (INDEPENDENT_AMBULATORY_CARE_PROVIDER_SITE_OTHER): Payer: BC Managed Care – PPO | Admitting: Neurology

## 2021-03-05 ENCOUNTER — Encounter: Payer: Self-pay | Admitting: Neurology

## 2021-03-05 ENCOUNTER — Other Ambulatory Visit: Payer: Self-pay

## 2021-03-05 ENCOUNTER — Encounter (INDEPENDENT_AMBULATORY_CARE_PROVIDER_SITE_OTHER): Payer: BC Managed Care – PPO | Admitting: Neurology

## 2021-03-05 DIAGNOSIS — R200 Anesthesia of skin: Secondary | ICD-10-CM

## 2021-03-05 DIAGNOSIS — Z0289 Encounter for other administrative examinations: Secondary | ICD-10-CM

## 2021-03-05 DIAGNOSIS — R202 Paresthesia of skin: Secondary | ICD-10-CM

## 2021-03-05 NOTE — Procedures (Signed)
Full Name: Robin Fleming Gender: Female MRN #: [id number] Date of Birth: [birth year]    Visit Date: 03/05/2021 08:13 Age: 51 Years Examining Physician: Marcial Pacas, MD  Referring Physician: Andrey Spearman, MD Height: 5 feet 2 inch Patient History: 176lbs History: 51 year old female, complains of intermittent left hand paresthesia,  Summary of the test: Nerve conduction study: Left median, ulnar sensory and motor responses were normal.  Left median mixed response were within normal limit.  Electromyography: Selected needle examination of left upper extremity muscles and left cervical paraspinal muscles were normal.  Conclusion: This is a normal study.  There is no electrodiagnostic evidence of left upper extremity neuropathy, in specific left carpal tunnel syndrome or left cervical radiculopathy.   ------------------------------- Marcial Pacas M.D. Ph.D.  [facility] [address] [place], [place] [address] Tel: [phone] Fax: [phone]  Verbal informed consent was obtained from the patient, patient was informed of potential risk of procedure, including bruising, bleeding, hematoma formation, infection, muscle weakness, muscle pain, numbness, among others.        [place]    Nerve / Sites Muscle Latency Ref. Amplitude Ref. Rel Amp Segments Distance Velocity Ref. Area    ms ms mV mV %  cm m/s m/s mVms  L Median - APB     Wrist APB 3.6 ?4.4 10.0 ?4.0 100 Wrist - APB 7   47.5     Upper arm APB 7.0  9.4  93.9 Upper arm - Wrist 20 58 ?49 45.3  L Ulnar - ADM     Wrist ADM 3.0 ?3.3 15.0 ?6.0 100 Wrist - ADM 7   36.5     B.Elbow ADM 6.1  14.2  94.4 B.Elbow - Wrist 17 55 ?49 35.2     A.Elbow ADM 8.0  13.7  96.6 A.Elbow - B.Elbow 10 54 ?49 35.1         SNC    Nerve / Sites Rec. Site Peak Lat Ref.  Amp Ref. Segments Distance Peak Diff Ref.    ms ms V V  cm ms ms  L Median, Ulnar - Transcarpal comparison     Median Palm Wrist 2.0 ?2.2 36 ?35  Median Palm - Wrist 8       Ulnar Palm Wrist 2.2 ?2.2 10 ?12 Ulnar Palm - Wrist 8          Median Palm - Ulnar Palm  -0.1 ?0.4  L Median - Orthodromic (Dig II, Mid palm)     Dig II Wrist 3.2 ?3.4 11 ?10 Dig II - Wrist 13    L Ulnar - Orthodromic, (Dig V, Mid palm)     Dig V Wrist 3.1 ?3.1 5 ?5 Dig V - Wrist 52             F  Wave    Nerve F Lat Ref.   ms ms  L Ulnar - ADM 26.7 ?32.0       EMG Summary Table    Spontaneous MUAP Recruitment  Muscle IA Fib PSW Fasc Other Amp Dur. Poly Pattern  L. First dorsal interosseous Normal None None None _______ Normal Normal Normal Normal  L. Biceps brachii Normal None None None _______ Normal Normal Normal Normal  L. Deltoid Normal None None None _______ Normal Normal Normal Normal  L. Triceps brachii Normal None None None _______ Normal Normal Normal Normal  L. Pronator teres Normal None None None _______ Normal Normal Normal Normal  L. Brachioradialis Normal  None None None _______ Normal Normal Normal Normal  L. Cervical paraspinals Normal None None None _______ Normal Normal Normal Normal

## 2021-08-13 ENCOUNTER — Other Ambulatory Visit: Payer: Self-pay | Admitting: Internal Medicine

## 2021-08-13 DIAGNOSIS — Z1231 Encounter for screening mammogram for malignant neoplasm of breast: Secondary | ICD-10-CM

## 2021-08-14 LAB — CBC
HCT: 43 % (ref 35.0–45.0)
Hemoglobin: 13.9 g/dL (ref 11.7–15.5)
MCH: 32 pg (ref 27.0–33.0)
MCHC: 32.3 g/dL (ref 32.0–36.0)
MCV: 99.1 fL (ref 80.0–100.0)
MPV: 8.7 fL (ref 7.5–12.5)
Platelets: 268 10*3/uL (ref 140–400)
RBC: 4.34 10*6/uL (ref 3.80–5.10)
RDW: 12.8 % (ref 11.0–15.0)
WBC: 3.5 10*3/uL — ABNORMAL LOW (ref 3.8–10.8)

## 2021-08-14 LAB — COMPLETE METABOLIC PANEL WITH GFR
AG Ratio: 1.2 (calc) (ref 1.0–2.5)
ALT: 11 U/L (ref 6–29)
AST: 19 U/L (ref 10–35)
Albumin: 4 g/dL (ref 3.6–5.1)
Alkaline phosphatase (APISO): 141 U/L (ref 37–153)
BUN: 17 mg/dL (ref 7–25)
CO2: 26 mmol/L (ref 20–32)
Calcium: 9.7 mg/dL (ref 8.6–10.4)
Chloride: 107 mmol/L (ref 98–110)
Creat: 0.92 mg/dL (ref 0.50–1.03)
Globulin: 3.3 g/dL (calc) (ref 1.9–3.7)
Glucose, Bld: 75 mg/dL (ref 65–99)
Potassium: 4.2 mmol/L (ref 3.5–5.3)
Sodium: 142 mmol/L (ref 135–146)
Total Bilirubin: 0.5 mg/dL (ref 0.2–1.2)
Total Protein: 7.3 g/dL (ref 6.1–8.1)
eGFR: 75 mL/min/{1.73_m2} (ref >=60)

## 2021-08-14 LAB — LIPID PANEL
Cholesterol: 177 mg/dL (ref <200)
HDL: 71 mg/dL (ref >=50)
LDL Cholesterol (Calc): 92 mg/dL (calc)
Non-HDL Cholesterol (Calc): 106 mg/dL (calc) (ref <130)
Total CHOL/HDL Ratio: 2.5 (calc) (ref <5.0)
Triglycerides: 46 mg/dL (ref <150)

## 2021-08-14 LAB — TSH: TSH: 1.33 mIU/L

## 2021-08-14 LAB — VITAMIN D 25 HYDROXY (VIT D DEFICIENCY, FRACTURES): Vit D, 25-Hydroxy: 52 ng/mL (ref 30–100)

## 2021-10-31 ENCOUNTER — Ambulatory Visit: Payer: Medicaid Other

## 2021-11-28 ENCOUNTER — Ambulatory Visit: Payer: Self-pay

## 2022-02-28 IMAGING — MG MM DIGITAL SCREENING BILAT W/ TOMO AND CAD
8 series · 8 of 24 positions shown · non-contrast
Comparison: Previous exam(s).

CLINICAL DATA: Screening.

EXAM:
DIGITAL SCREENING BILATERAL MAMMOGRAM WITH TOMOSYNTHESIS AND CAD
TECHNIQUE: Bilateral screening digital craniocaudal and mediolateral oblique
mammograms were obtained. Bilateral screening digital breast
tomosynthesis was performed. The images were evaluated with
computer-aided detection.

[L CC synth-2D]
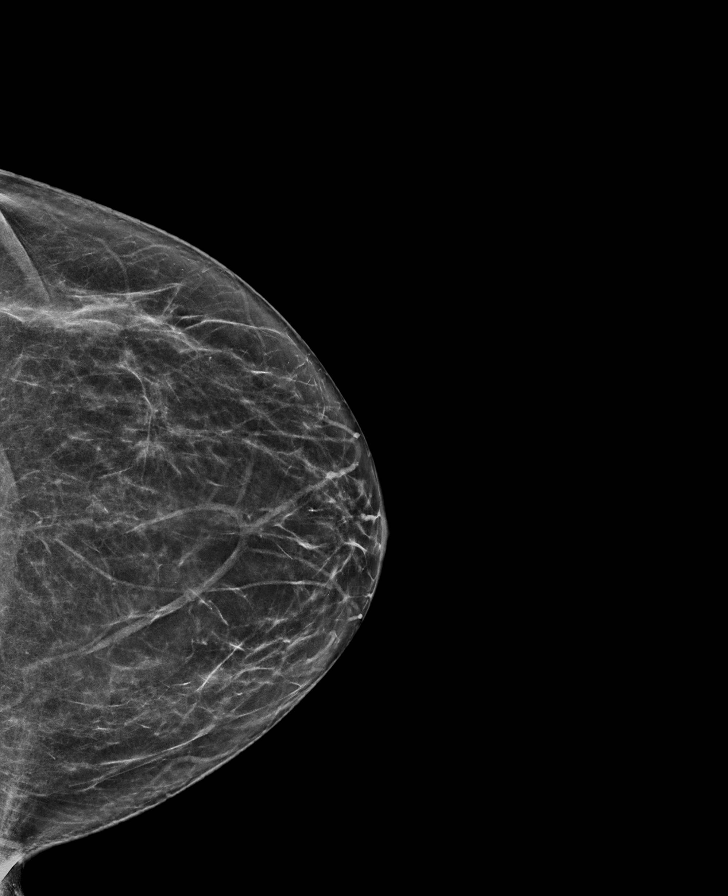

[R MLO synth-2D]
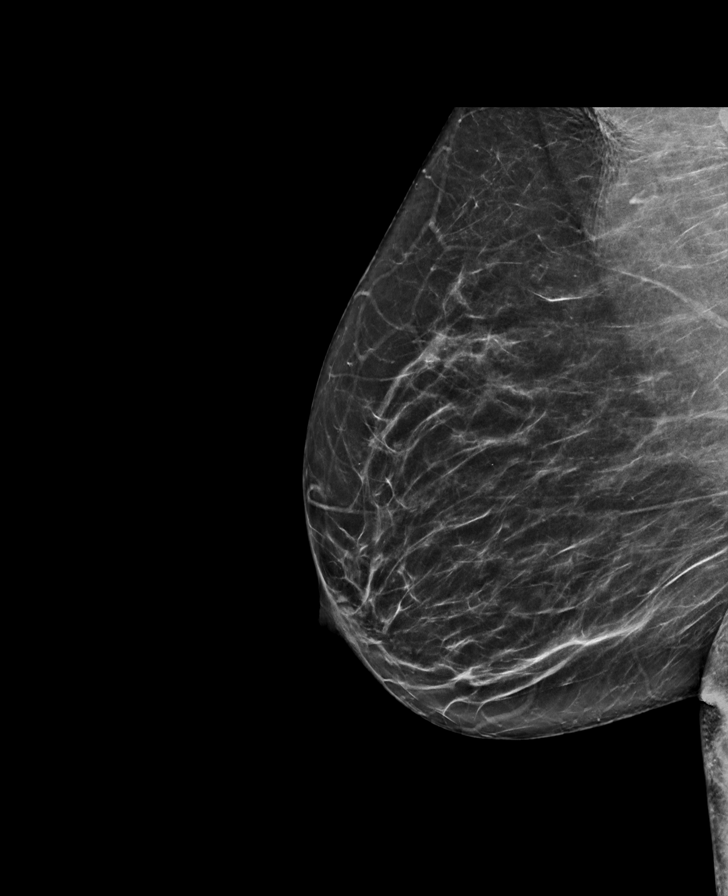

[R CC synth-2D]
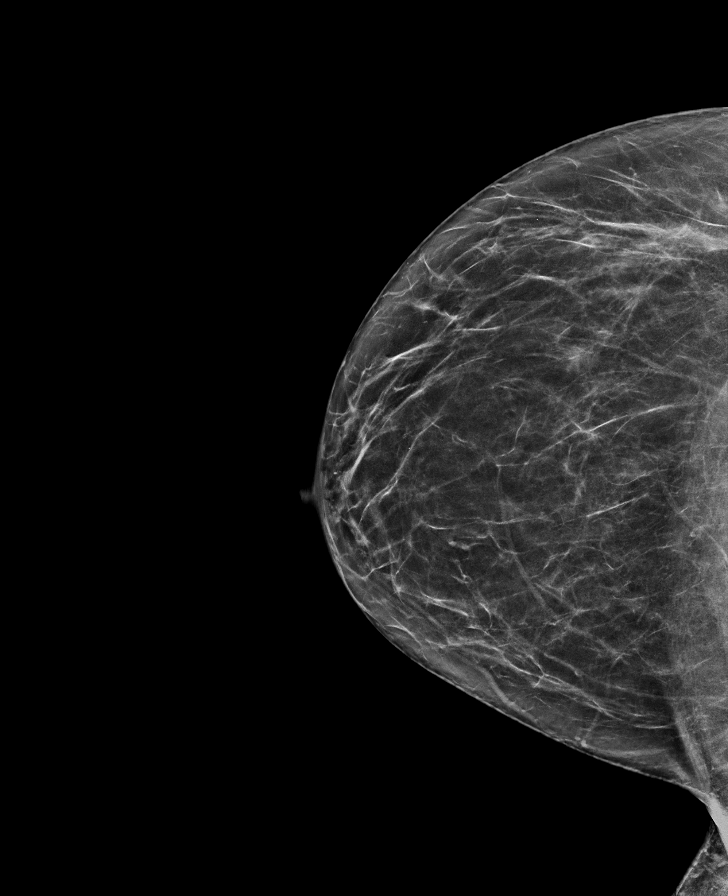

[L MLO synth-2D]
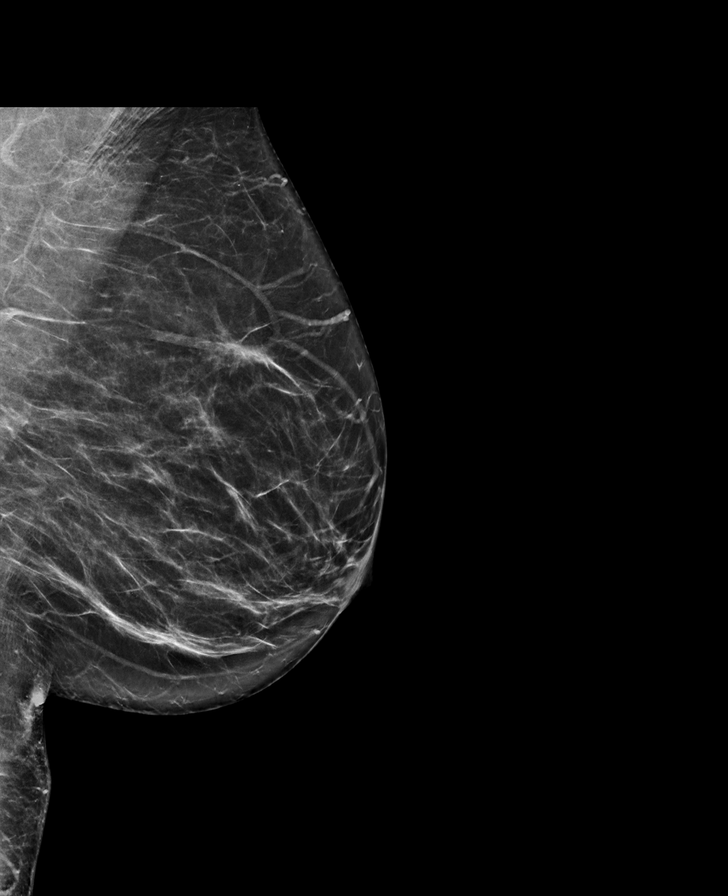

[R MLO tomo · tomo slice 36/71.0]
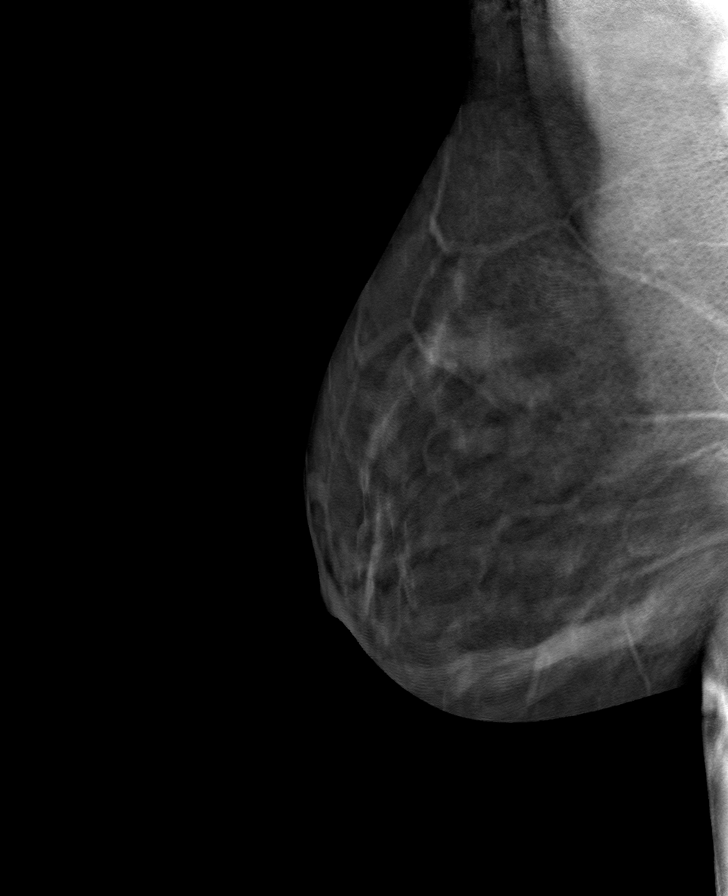

[R CC tomo · tomo slice 33/66.0]
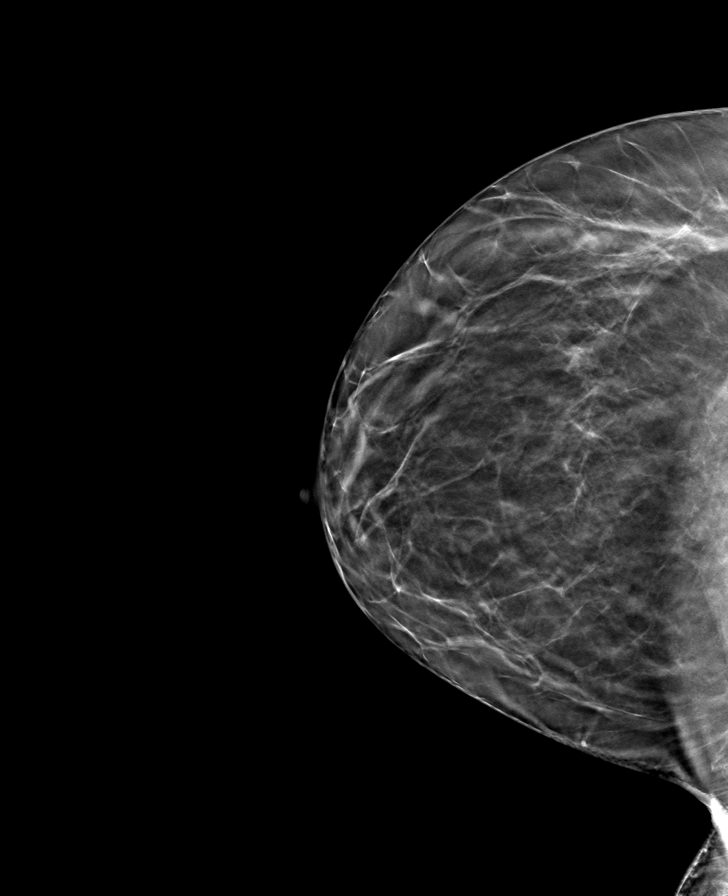

[L MLO tomo · tomo slice 37/72.0]
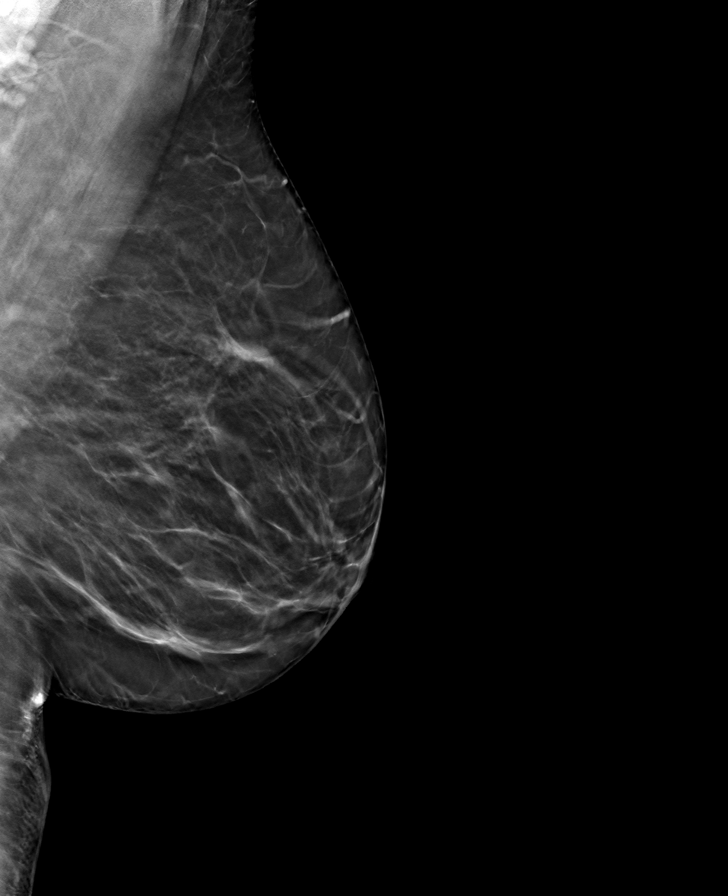

[L CC tomo · tomo slice 33/65.0]
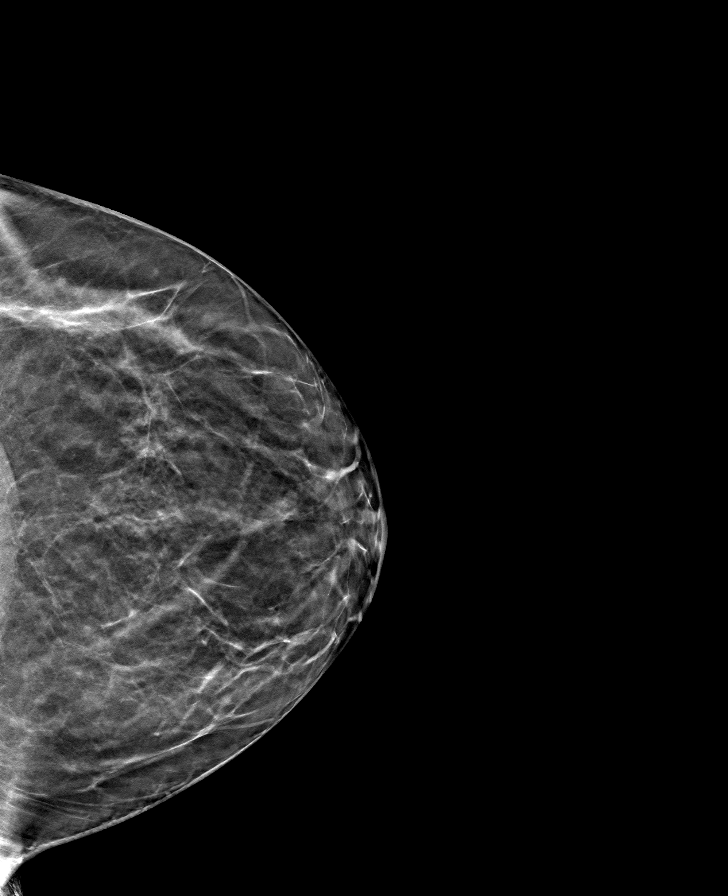

[8 of 24 positions shown; findings below may reference images not displayed]

ACR Breast Density Category b: There are scattered areas of
fibroglandular density.
FINDINGS: There are no findings suspicious for malignancy.
IMPRESSION: No mammographic evidence of malignancy. A result letter of this
screening mammogram will be mailed directly to the patient.

RECOMMENDATION:
Screening mammogram in one year. (Code:51-O-LD2)

BI-RADS CATEGORY  1: Negative.

## 2022-05-19 ENCOUNTER — Emergency Department (HOSPITAL_COMMUNITY): Payer: No Typology Code available for payment source

## 2022-05-19 ENCOUNTER — Other Ambulatory Visit: Payer: Self-pay

## 2022-05-19 ENCOUNTER — Encounter (HOSPITAL_COMMUNITY): Payer: Self-pay

## 2022-05-19 ENCOUNTER — Emergency Department (HOSPITAL_COMMUNITY)
Admission: EM | Admit: 2022-05-19 | Discharge: 2022-05-19 | Disposition: A | Discharge status: Home / Self Care | Payer: No Typology Code available for payment source | Attending: Emergency Medicine | Admitting: Emergency Medicine

## 2022-05-19 DIAGNOSIS — Y92410 Unspecified street and highway as the place of occurrence of the external cause: Secondary | ICD-10-CM | POA: Insufficient documentation

## 2022-05-19 DIAGNOSIS — Z79899 Other long term (current) drug therapy: Secondary | ICD-10-CM | POA: Diagnosis not present

## 2022-05-19 DIAGNOSIS — I10 Essential (primary) hypertension: Secondary | ICD-10-CM | POA: Diagnosis not present

## 2022-05-19 DIAGNOSIS — S199XXA Unspecified injury of neck, initial encounter: Secondary | ICD-10-CM | POA: Diagnosis present

## 2022-05-19 DIAGNOSIS — S60511A Abrasion of right hand, initial encounter: Secondary | ICD-10-CM | POA: Diagnosis not present

## 2022-05-19 DIAGNOSIS — S20212A Contusion of left front wall of thorax, initial encounter: Secondary | ICD-10-CM | POA: Diagnosis not present

## 2022-05-19 DIAGNOSIS — Z7951 Long term (current) use of inhaled steroids: Secondary | ICD-10-CM | POA: Diagnosis not present

## 2022-05-19 DIAGNOSIS — J45909 Unspecified asthma, uncomplicated: Secondary | ICD-10-CM | POA: Diagnosis not present

## 2022-05-19 DIAGNOSIS — S12101A Unspecified nondisplaced fracture of second cervical vertebra, initial encounter for closed fracture: Secondary | ICD-10-CM | POA: Diagnosis not present

## 2022-05-19 DIAGNOSIS — S0990XA Unspecified injury of head, initial encounter: Secondary | ICD-10-CM | POA: Diagnosis not present

## 2022-05-19 DIAGNOSIS — R911 Solitary pulmonary nodule: Secondary | ICD-10-CM

## 2022-05-19 LAB — COMPREHENSIVE METABOLIC PANEL
ALT: 15 U/L (ref 0–44)
AST: 27 U/L (ref 15–41)
Albumin: 3.4 g/dL — ABNORMAL LOW (ref 3.5–5.0)
Alkaline Phosphatase: 116 U/L (ref 38–126)
Anion gap: 8 (ref 5–15)
BUN: 17 mg/dL (ref 6–20)
CO2: 24 mmol/L (ref 22–32)
Calcium: 9.1 mg/dL (ref 8.9–10.3)
Chloride: 105 mmol/L (ref 98–111)
Creatinine, Ser: 0.9 mg/dL (ref 0.44–1.00)
GFR, Estimated: >60 mL/min (ref >60)
Glucose, Bld: 126 mg/dL — ABNORMAL HIGH (ref 70–99)
Potassium: 3.2 mmol/L — ABNORMAL LOW (ref 3.5–5.1)
Sodium: 137 mmol/L (ref 135–145)
Total Bilirubin: 0.6 mg/dL (ref 0.3–1.2)
Total Protein: 7.3 g/dL (ref 6.5–8.1)

## 2022-05-19 LAB — RAPID URINE DRUG SCREEN, HOSP PERFORMED
Amphetamines: NOT DETECTED
Barbiturates: NOT DETECTED
Benzodiazepines: NOT DETECTED
Cocaine: NOT DETECTED
Opiates: NOT DETECTED
Tetrahydrocannabinol: NOT DETECTED

## 2022-05-19 LAB — CBC WITH DIFFERENTIAL/PLATELET
Abs Immature Granulocytes: 0.04 10*3/uL (ref 0.00–0.07)
Basophils Absolute: 0 10*3/uL (ref 0.0–0.1)
Basophils Relative: 0 %
Eosinophils Absolute: 0.2 10*3/uL (ref 0.0–0.5)
Eosinophils Relative: 3 %
HCT: 40.9 % (ref 36.0–46.0)
Hemoglobin: 13.2 g/dL (ref 12.0–15.0)
Immature Granulocytes: 1 %
Lymphocytes Relative: 37 %
Lymphs Abs: 2.5 10*3/uL (ref 0.7–4.0)
MCH: 31.8 pg (ref 26.0–34.0)
MCHC: 32.3 g/dL (ref 30.0–36.0)
MCV: 98.6 fL (ref 80.0–100.0)
Monocytes Absolute: 0.3 10*3/uL (ref 0.1–1.0)
Monocytes Relative: 5 %
Neutro Abs: 3.7 10*3/uL (ref 1.7–7.7)
Neutrophils Relative %: 54 %
Platelets: 276 10*3/uL (ref 150–400)
RBC: 4.15 MIL/uL (ref 3.87–5.11)
RDW: 13.1 % (ref 11.5–15.5)
WBC: 6.8 10*3/uL (ref 4.0–10.5)
nRBC: 0 % (ref 0.0–0.2)

## 2022-05-19 LAB — I-STAT BETA HCG BLOOD, ED (MC, WL, AP ONLY): I-stat hCG, quantitative: <5 m[IU]/mL (ref <5)

## 2022-05-19 LAB — SAMPLE TO BLOOD BANK

## 2022-05-19 LAB — ETHANOL: Alcohol, Ethyl (B): <10 mg/dL (ref <10)

## 2022-05-19 MED ORDER — IOHEXOL 350 MG/ML SOLN
50.0000 mL | Freq: Once | INTRAVENOUS | Status: AC | PRN
  Administered 2022-05-19: 50 mL via INTRAVENOUS

## 2022-05-19 MED ORDER — IOHEXOL 350 MG/ML SOLN
75.0000 mL | Freq: Once | INTRAVENOUS | Status: AC | PRN
  Administered 2022-05-19: 75 mL via INTRAVENOUS

## 2022-05-19 MED ORDER — KETOROLAC TROMETHAMINE 15 MG/ML IJ SOLN
15.0000 mg | Freq: Once | INTRAMUSCULAR | Status: AC
  Administered 2022-05-19: 15 mg via INTRAVENOUS
  Filled 2022-05-19: qty 1

## 2022-05-19 NOTE — ED Notes (Signed)
Pt placed in [place] J collar at this time  ?
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
## 2022-05-19 NOTE — ED Notes (Signed)
Pt arrived with c-collar in place and pt remains with c-collar at this time.
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
## 2022-05-23 ENCOUNTER — Other Ambulatory Visit: Payer: Self-pay

## 2022-05-23 ENCOUNTER — Emergency Department (HOSPITAL_COMMUNITY)
Admission: EM | Admit: 2022-05-23 | Discharge: 2022-05-23 | Disposition: A | Discharge status: Home / Self Care | Payer: Medicaid Other | Attending: Emergency Medicine | Admitting: Emergency Medicine

## 2022-05-23 ENCOUNTER — Emergency Department (HOSPITAL_COMMUNITY): Payer: Medicaid Other

## 2022-05-23 ENCOUNTER — Emergency Department (HOSPITAL_COMMUNITY): Payer: No Typology Code available for payment source

## 2022-05-23 DIAGNOSIS — Y92410 Unspecified street and highway as the place of occurrence of the external cause: Secondary | ICD-10-CM | POA: Insufficient documentation

## 2022-05-23 DIAGNOSIS — J45909 Unspecified asthma, uncomplicated: Secondary | ICD-10-CM | POA: Insufficient documentation

## 2022-05-23 DIAGNOSIS — S299XXA Unspecified injury of thorax, initial encounter: Secondary | ICD-10-CM | POA: Diagnosis present

## 2022-05-23 DIAGNOSIS — R079 Chest pain, unspecified: Secondary | ICD-10-CM

## 2022-05-23 DIAGNOSIS — S2231XA Fracture of one rib, right side, initial encounter for closed fracture: Secondary | ICD-10-CM | POA: Diagnosis not present

## 2022-05-23 DIAGNOSIS — I10 Essential (primary) hypertension: Secondary | ICD-10-CM | POA: Insufficient documentation

## 2022-05-23 LAB — CBC WITH DIFFERENTIAL/PLATELET
Abs Immature Granulocytes: 0.01 10*3/uL (ref 0.00–0.07)
Basophils Absolute: 0 10*3/uL (ref 0.0–0.1)
Basophils Relative: 0 %
Eosinophils Absolute: 0.1 10*3/uL (ref 0.0–0.5)
Eosinophils Relative: 3 %
HCT: 38.6 % (ref 36.0–46.0)
Hemoglobin: 12.8 g/dL (ref 12.0–15.0)
Immature Granulocytes: 0 %
Lymphocytes Relative: 31 %
Lymphs Abs: 1.5 10*3/uL (ref 0.7–4.0)
MCH: 32.1 pg (ref 26.0–34.0)
MCHC: 33.2 g/dL (ref 30.0–36.0)
MCV: 96.7 fL (ref 80.0–100.0)
Monocytes Absolute: 0.3 10*3/uL (ref 0.1–1.0)
Monocytes Relative: 6 %
Neutro Abs: 2.9 10*3/uL (ref 1.7–7.7)
Neutrophils Relative %: 60 %
Platelets: 282 10*3/uL (ref 150–400)
RBC: 3.99 MIL/uL (ref 3.87–5.11)
RDW: 13.2 % (ref 11.5–15.5)
WBC: 4.8 10*3/uL (ref 4.0–10.5)
nRBC: 0 % (ref 0.0–0.2)

## 2022-05-23 LAB — BASIC METABOLIC PANEL
Anion gap: 9 (ref 5–15)
BUN: 10 mg/dL (ref 6–20)
CO2: 24 mmol/L (ref 22–32)
Calcium: 9.4 mg/dL (ref 8.9–10.3)
Chloride: 106 mmol/L (ref 98–111)
Creatinine, Ser: 0.72 mg/dL (ref 0.44–1.00)
GFR, Estimated: >60 mL/min (ref >60)
Glucose, Bld: 76 mg/dL (ref 70–99)
Potassium: 3.7 mmol/L (ref 3.5–5.1)
Sodium: 139 mmol/L (ref 135–145)

## 2022-05-23 LAB — TROPONIN I (HIGH SENSITIVITY): Troponin I (High Sensitivity): 3 ng/L (ref <18)

## 2022-05-23 LAB — I-STAT BETA HCG BLOOD, ED (MC, WL, AP ONLY): I-stat hCG, quantitative: <5 m[IU]/mL (ref <5)

## 2022-05-23 MED ORDER — METHOCARBAMOL 500 MG PO TABS: 500.0000 mg | ORAL_TABLET | Freq: Two times a day (BID) | ORAL | 0 refills | Status: AC

## 2022-05-23 MED ORDER — HYDROCODONE-ACETAMINOPHEN 5-325 MG PO TABS: 1.0000 | ORAL_TABLET | Freq: Four times a day (QID) | ORAL | 0 refills | Status: AC | PRN

## 2022-05-23 MED ORDER — IOHEXOL 350 MG/ML SOLN
75.0000 mL | Freq: Once | INTRAVENOUS | Status: AC | PRN
  Administered 2022-05-23: 75 mL via INTRAVENOUS
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
## 2022-05-23 NOTE — Discharge Instructions (Addendum)
It was a pleasure taking care of you today.  As discussed, your CT scan showed a rib fracture which is likely causing your pain.  I am sending you with pain medication.  Medication can cause drowsiness so do not drive or operate machinery while the medication.  Please follow-up with PCP within 1 week.  Return to the ER for any worsening symptoms.
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
## 2022-08-05 ENCOUNTER — Other Ambulatory Visit: Payer: Self-pay | Admitting: Internal Medicine

## 2022-08-07 LAB — LIPID PANEL
Cholesterol: 194 mg/dL (ref <200)
HDL: 69 mg/dL (ref >=50)
LDL Cholesterol (Calc): 108 mg/dL (calc) — ABNORMAL HIGH
Non-HDL Cholesterol (Calc): 125 mg/dL (calc) (ref <130)
Total CHOL/HDL Ratio: 2.8 (calc) (ref <5.0)
Triglycerides: 76 mg/dL (ref <150)

## 2022-08-07 LAB — COMPLETE METABOLIC PANEL WITH GFR
AG Ratio: 1.2 (calc) (ref 1.0–2.5)
ALT: 10 U/L (ref 6–29)
AST: 21 U/L (ref 10–35)
Albumin: 4 g/dL (ref 3.6–5.1)
Alkaline phosphatase (APISO): 139 U/L (ref 37–153)
BUN: 11 mg/dL (ref 7–25)
CO2: 23 mmol/L (ref 20–32)
Calcium: 9.4 mg/dL (ref 8.6–10.4)
Chloride: 107 mmol/L (ref 98–110)
Creat: 0.76 mg/dL (ref 0.50–1.03)
Globulin: 3.3 g/dL (calc) (ref 1.9–3.7)
Glucose, Bld: 91 mg/dL (ref 65–99)
Potassium: 3.8 mmol/L (ref 3.5–5.3)
Sodium: 141 mmol/L (ref 135–146)
Total Bilirubin: 0.4 mg/dL (ref 0.2–1.2)
Total Protein: 7.3 g/dL (ref 6.1–8.1)
eGFR: 94 mL/min/{1.73_m2} (ref >=60)

## 2022-08-07 LAB — URINE CULTURE
MICRO NUMBER:: 15130432
Result:: NO GROWTH
SPECIMEN QUALITY:: ADEQUATE

## 2022-08-07 LAB — CBC
HCT: 39.5 % (ref 35.0–45.0)
Hemoglobin: 13.2 g/dL (ref 11.7–15.5)
MCH: 31.9 pg (ref 27.0–33.0)
MCHC: 33.4 g/dL (ref 32.0–36.0)
MCV: 95.4 fL (ref 80.0–100.0)
MPV: 8.8 fL (ref 7.5–12.5)
Platelets: 297 10*3/uL (ref 140–400)
RBC: 4.14 10*6/uL (ref 3.80–5.10)
RDW: 12.8 % (ref 11.0–15.0)
WBC: 4 10*3/uL (ref 3.8–10.8)

## 2022-08-07 LAB — VITAMIN D 25 HYDROXY (VIT D DEFICIENCY, FRACTURES): Vit D, 25-Hydroxy: 56 ng/mL (ref 30–100)

## 2022-08-07 LAB — TSH: TSH: 1.7 mIU/L

## 2022-08-14 ENCOUNTER — Ambulatory Visit
Admission: RE | Admit: 2022-08-14 | Discharge: 2022-08-14 | Disposition: A | Discharge status: Home / Self Care | Payer: Medicaid Other | Source: Ambulatory Visit | Attending: Internal Medicine | Admitting: Internal Medicine

## 2022-08-14 DIAGNOSIS — Z1231 Encounter for screening mammogram for malignant neoplasm of breast: Secondary | ICD-10-CM

## 2022-12-31 ENCOUNTER — Other Ambulatory Visit: Payer: Self-pay | Admitting: Medical Genetics

## 2022-12-31 DIAGNOSIS — Z006 Encounter for examination for normal comparison and control in clinical research program: Secondary | ICD-10-CM

## 2023-02-02 ENCOUNTER — Other Ambulatory Visit (HOSPITAL_COMMUNITY)
Admission: RE | Admit: 2023-02-02 | Discharge: 2023-02-02 | Disposition: A | Discharge status: Home / Self Care | Payer: Self-pay | Source: Ambulatory Visit | Attending: Oncology | Admitting: Oncology

## 2023-02-02 DIAGNOSIS — Z006 Encounter for examination for normal comparison and control in clinical research program: Secondary | ICD-10-CM | POA: Insufficient documentation

## 2023-02-24 LAB — GENECONNECT MOLECULAR SCREEN: Genetic Analysis Overall Interpretation: NEGATIVE

## 2023-04-23 ENCOUNTER — Encounter: Payer: Self-pay | Admitting: Obstetrics & Gynecology

## 2023-04-23 ENCOUNTER — Ambulatory Visit: Payer: Medicaid Other | Admitting: Obstetrics & Gynecology

## 2023-04-23 ENCOUNTER — Other Ambulatory Visit (HOSPITAL_COMMUNITY)
Admission: RE | Admit: 2023-04-23 | Discharge: 2023-04-23 | Disposition: A | Discharge status: Home / Self Care | Source: Ambulatory Visit | Attending: Obstetrics & Gynecology | Admitting: Obstetrics & Gynecology

## 2023-04-23 VITALS — BP 124/84 | HR 87 | Ht 62.0 in | Wt 210.0 lb

## 2023-04-23 DIAGNOSIS — Z113 Encounter for screening for infections with a predominantly sexual mode of transmission: Secondary | ICD-10-CM | POA: Diagnosis present

## 2023-04-23 DIAGNOSIS — Z01419 Encounter for gynecological examination (general) (routine) without abnormal findings: Secondary | ICD-10-CM

## 2023-04-23 NOTE — Progress Notes (Signed)
 GYNECOLOGY CLINIC ANNUAL PREVENTATIVE CARE ENCOUNTER NOTE  Subjective:   Robin Fleming is a 53 y.o. G19P2002 female here for a routine annual gynecologic exam.  Current complaints: none.   Denies abnormal vaginal bleeding, discharge, pelvic pain, problems with intercourse or other gynecologic concerns.    Gynecologic History No LMP recorded (lmp unknown). Patient is postmenopausal. Contraception: post menopausal status, BTL Last Pap: 2022. Results were: normal Last mammogram: 2024. Results were: normal  Obstetric History OB History  Gravida Para Term Preterm AB Living  2 2 2   2   SAB IAB Ectopic Multiple Live Births      2    # Outcome Date GA Lbr Len/2nd Weight Sex Type Anes PTL Lv  2 Term         LIV  1 Term         LIV    Past Medical History:  Diagnosis Date   Anemia    Asthma    Asthma    Depression    Hypertension    Menopause    Numbness    Obesity    Optic neuritis    Sleep apnea    not using CPAP   Sleep apnea     Past Surgical History:  Procedure Laterality Date   MOUTH SURGERY     TUBAL LIGATION      Current Outpatient Medications on File Prior to Visit  Medication Sig Dispense Refill   VITAMIN D PO Take by mouth.     albuterol (VENTOLIN) 2 MG/5ML syrup Take 2 mg by mouth 3 (three) times daily.     APPLE CIDER VINEGAR PO Take by mouth. (Patient not taking: Reported on 04/23/2023)     HYDROcodone-acetaminophen (NORCO/VICODIN) 5-325 MG tablet Take 1 tablet by mouth every 6 (six) hours as needed for up to 6 doses. (Patient not taking: Reported on 04/23/2023) 6 tablet 0   MAGNESIUM PO Take by mouth. (Patient not taking: Reported on 04/23/2023)     methocarbamol (ROBAXIN) 500 MG tablet Take 1 tablet (500 mg total) by mouth 2 (two) times daily. (Patient not taking: Reported on 04/23/2023) 20 tablet 0   No current facility-administered medications on file prior to visit.    Allergies  Allergen Reactions   Dulera [Mometasone Furo-Formoterol Fum]     Jittery    Gadolinium      Desc: Pt developed nausea and vomiting directly after hand injecting 17cc Multihance for MRI Brain.    Other Nausea And Vomiting    gadolium- Desc: Pt developed nausea and vomiting directly after hand injecting 17cc Multihance for MRI Brain.    Social History   Socioeconomic History   Marital status: Widowed    Spouse name: Not on file   Number of children: Not on file   Years of education: Not on file   Highest education level: Not on file  Occupational History   Not on file  Tobacco Use   Smoking status: Never   Smokeless tobacco: Never  Vaping Use   Vaping status: Never Used  Substance and Sexual Activity   Alcohol use: Not Currently    Comment: occ   Drug use: No   Sexual activity: Not Currently    Partners: Male    Birth control/protection: Surgical  Other Topics Concern   Not on file  Social History Narrative   No caffeine use    Social Drivers of Corporate investment banker Strain: Not on file  Food Insecurity: Not on file  Transportation Needs: Not on file  Physical Activity: Not on file  Stress: Not on file (12/17/2022)  Social Connections: Unknown (06/20/2021)   Received from [facility]   Social Network    Social Network: Not on file  Intimate Partner Violence: Unknown (05/12/2021)   Received from [facility], [facility]   HITS    Physically Hurt: Not on file    Insult or Talk Down To: Not on file    Threaten Physical Harm: Not on file    Scream or Curse: Not on file    Family History  Problem Relation Age of Onset   Hypertension Mother    Kidney disease Father    Colon cancer Neg Hx    Colon polyps Neg Hx    Esophageal cancer Neg Hx    Stomach cancer Neg Hx    Rectal cancer Neg Hx     The following portions of the patient's history were reviewed and updated as appropriate: allergies, current medications, past family history, past medical history, past social history, past surgical history and problem  list.  Review of Systems Pertinent items are noted in HPI.   Objective:  BP 124/84   Pulse 87   Ht 5\' 2"  (1.575 m)   Wt 210 lb (95.3 kg)   LMP  (LMP Unknown) Comment: no cycle since October 2015  BMI 38.41 kg/m  CONSTITUTIONAL: Well-developed, well-nourished female in no acute distress.  HENT:  Normocephalic, atraumatic, External right and left ear normal. Oropharynx is clear and moist EYES: Conjunctivae and EOM are normal. Pupils are equal, round, and reactive to light. No scleral icterus.  NECK: Normal range of motion, supple, no masses.  Normal thyroid.  SKIN: Skin is warm and dry. No rash noted. Not diaphoretic. No erythema. No pallor. NEUROLGIC: Alert and oriented to person, place, and time. Normal reflexes, muscle tone coordination. No cranial nerve deficit noted. PSYCHIATRIC: Normal mood and affect. Normal behavior. Normal judgment and thought content. CARDIOVASCULAR: Normal heart rate noted, regular rhythm RESPIRATORY: Clear to auscultation bilaterally. Effort and breath sounds normal, no problems with respiration noted. BREASTS: Symmetric in size. No masses, skin changes, nipple drainage, or lymphadenopathy. ABDOMEN: Soft, normal bowel sounds, no distention noted.  No tenderness, rebound or guarding.  PELVIC: Normal appearing external genitalia; normal appearing vaginal mucosa and cervix.  No abnormal discharge noted. MUSCULOSKELETAL: Normal range of motion. No tenderness.  No cyanosis, clubbing, or edema.   Assessment:  Annual gynecologic examination with pap smear   Plan:  Will follow up results of pap smear and manage accordingly. Mammogram scheduled Routine preventative health maintenance measures emphasized. Please refer to After Visit Summary for other counseling recommendations.  She requested STD screen  Scheryl Darter, MD Attending Obstetrician & Gynecologist Center for [facility], [facility]

## 2023-04-23 NOTE — Progress Notes (Signed)
 Pt presents for annual exam.  Desires STI workup.

## 2023-04-24 LAB — HEPATITIS C ANTIBODY: Hep C Virus Ab: NONREACTIVE

## 2023-04-24 LAB — RPR: RPR Ser Ql: NONREACTIVE

## 2023-04-24 LAB — HEPATITIS B SURFACE ANTIGEN: Hepatitis B Surface Ag: NEGATIVE

## 2023-04-24 LAB — HIV ANTIBODY (ROUTINE TESTING W REFLEX): HIV Screen 4th Generation wRfx: NONREACTIVE

## 2023-04-26 LAB — CERVICOVAGINAL ANCILLARY ONLY
Chlamydia: NEGATIVE
Comment: NEGATIVE
Comment: NEGATIVE
Comment: NORMAL
Neisseria Gonorrhea: NEGATIVE
Trichomonas: NEGATIVE

## 2023-04-27 ENCOUNTER — Encounter: Payer: Self-pay | Admitting: Obstetrics & Gynecology

## 2023-07-19 ENCOUNTER — Other Ambulatory Visit: Payer: Self-pay | Admitting: Internal Medicine

## 2023-07-19 DIAGNOSIS — Z0000 Encounter for general adult medical examination without abnormal findings: Secondary | ICD-10-CM

## 2023-08-04 ENCOUNTER — Encounter

## 2023-08-17 ENCOUNTER — Ambulatory Visit
Admission: RE | Admit: 2023-08-17 | Discharge: 2023-08-17 | Disposition: A | Discharge status: Home / Self Care | Source: Ambulatory Visit | Attending: Internal Medicine | Admitting: Internal Medicine

## 2023-08-17 DIAGNOSIS — Z0000 Encounter for general adult medical examination without abnormal findings: Secondary | ICD-10-CM
